# Patient Record
Sex: Female | Born: 1995 | Race: Black or African American | Hispanic: No | Marital: Single | Smoking: Never smoker
Health system: Southern US, Community
[De-identification: ages and names within clinical notes are randomized; demographics above are authoritative.]

## PROBLEM LIST (undated history)

## (undated) DIAGNOSIS — J302 Other seasonal allergic rhinitis: Secondary | ICD-10-CM

---
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
## 2013-09-21 ENCOUNTER — Encounter (HOSPITAL_COMMUNITY): Payer: Self-pay | Admitting: Emergency Medicine

## 2013-09-21 ENCOUNTER — Emergency Department (HOSPITAL_COMMUNITY)
Admission: EM | Admit: 2013-09-21 | Discharge: 2013-09-21 | Disposition: A | Discharge status: Home / Self Care | Payer: BC Managed Care – PPO | Source: Home / Self Care | Attending: Emergency Medicine | Admitting: Emergency Medicine

## 2013-09-21 DIAGNOSIS — L259 Unspecified contact dermatitis, unspecified cause: Secondary | ICD-10-CM

## 2013-09-21 MED ORDER — PREDNISONE 20 MG PO TABS: ORAL_TABLET | ORAL | Status: DC

## 2013-09-21 MED ORDER — TRIAMCINOLONE ACETONIDE 0.1 % EX CREA: TOPICAL_CREAM | Freq: Three times a day (TID) | CUTANEOUS | Status: DC

## 2013-09-21 NOTE — ED Notes (Signed)
See physicians assessment. 

## 2013-09-21 NOTE — ED Provider Notes (Signed)
Chief Complaint:   Chief Complaint  Patient presents with  . Allergic Reaction    History of Present Illness:   Shannon Cisneros is a 17 year old female who has a five-day history of a mildly pruritic rash on her neck, upper chest, and behind or ears. This came on after she used a new hair care product containing several oils including Ahaba oil. She tried Benadryl and hydrocortisone cream but has not gone away yet. She denies any rash elsewhere. She's had no difficulty breathing or swelling of her lips, tongue, or throat. There've been no other suspicious exposures including no new soaps, detergents, washing powders, dryer sheets, or fabric softeners. No exposure to plants or animals. No new foods or medications. No new clothing. She has not worn any new or different jewelry.  Review of Systems:  Other than noted above, the patient denies any of the following symptoms: Systemic:  No fever, chills, sweats, weight loss, or fatigue. ENT:  No nasal congestion, rhinorrhea, sore throat, swelling of lips, tongue or throat. Resp:  No cough, wheezing, or shortness of breath. Skin:  No rash, itching, nodules, or suspicious lesions.  PMFSH:  Past medical history, family history, social history, meds, and allergies were reviewed. She has seasonal allergies and takes Zyrtec as needed.  Physical Exam:   Vital signs:  BP 123/70  Pulse 70  Temp(Src) 99.1 F (37.3 C) (Oral)  Resp 18  SpO2 100%  LMP 08/16/2013 Gen:  Alert, oriented, in no distress. ENT:  Pharynx clear, no intraoral lesions, moist mucous membranes. Lungs:  Clear to auscultation. Skin:  There was a rash consisting of small, erythematous maculopapules on both sides neck and the posterior neck extending up behind the ears and temporal area. There was no rash in the scalp or the face. No rash on the chest or elsewhere in the body.  Assessment:  The encounter diagnosis was Contact dermatitis.  Probably due to the haircare oral that she  used.  Plan:   1.  Meds:  The following meds were prescribed:   Discharge Medication List as of 09/21/2013  3:29 PM    START taking these medications   Details  predniSONE (DELTASONE) 20 MG tablet 3 daily for 4 days, 2 daily for 4 days, 1 daily for 4 days., Normal    triamcinolone cream (KENALOG) 0.1 % Apply topically 3 (three) times daily., Starting 09/21/2013, Until Discontinued, Normal        2.  Patient Education/Counseling:  The patient was given appropriate handouts, self care instructions, and instructed in symptomatic relief.  The patient was advised not to use the haircare oil again.  3.  Follow up:  The patient was told to follow up if no better in 3 to 4 days, if becoming worse in any way, and given some red flag symptoms such as difficulty breathing, wheezing, or swelling of the lips, tongue, or throat which would prompt immediate return.  Follow up here as necessary.      Reuben Likes, MD 09/21/13 [phone]

## 2013-10-14 ENCOUNTER — Encounter (HOSPITAL_COMMUNITY): Payer: Self-pay | Admitting: Emergency Medicine

## 2013-10-14 ENCOUNTER — Other Ambulatory Visit (HOSPITAL_COMMUNITY)
Admission: RE | Admit: 2013-10-14 | Discharge: 2013-10-14 | Disposition: A | Discharge status: Home / Self Care | Payer: BC Managed Care – PPO | Source: Ambulatory Visit | Attending: Family Medicine | Admitting: Family Medicine

## 2013-10-14 ENCOUNTER — Emergency Department (HOSPITAL_COMMUNITY)
Admission: EM | Admit: 2013-10-14 | Discharge: 2013-10-14 | Disposition: A | Discharge status: Home / Self Care | Payer: BC Managed Care – PPO | Source: Home / Self Care

## 2013-10-14 DIAGNOSIS — N390 Urinary tract infection, site not specified: Secondary | ICD-10-CM

## 2013-10-14 DIAGNOSIS — Z113 Encounter for screening for infections with a predominantly sexual mode of transmission: Secondary | ICD-10-CM | POA: Insufficient documentation

## 2013-10-14 DIAGNOSIS — N760 Acute vaginitis: Secondary | ICD-10-CM | POA: Insufficient documentation

## 2013-10-14 LAB — POCT URINALYSIS DIP (DEVICE)
Glucose, UA: NEGATIVE mg/dL
Ketones, ur: 40 mg/dL — AB
Protein, ur: 100 mg/dL — AB
Urobilinogen, UA: 0.2 mg/dL (ref 0.0–1.0)

## 2013-10-14 LAB — POCT PREGNANCY, URINE: Preg Test, Ur: NEGATIVE

## 2013-10-14 MED ORDER — CEPHALEXIN 500 MG PO CAPS: 500.0000 mg | ORAL_CAPSULE | Freq: Four times a day (QID) | ORAL | Status: DC

## 2013-10-14 NOTE — ED Provider Notes (Addendum)
CSN: [id number]     Arrival date & time 10/14/13  1115 History   None    Chief Complaint  Patient presents with  . Vaginal Discharge   (Consider location/radiation/quality/duration/timing/severity/associated sxs/prior Treatment) Patient is a 17 y.o. female presenting with vaginal discharge. The history is provided by the patient.  Vaginal Discharge Quality:  Watery and malodorous Severity:  Mild Onset quality:  Gradual Duration:  3 days Progression:  Unchanged Chronicity:  New Context: after intercourse and during urination   Associated symptoms: dysuria   Associated symptoms: no abdominal pain, no fever, no vaginal itching and no vomiting     History reviewed. No pertinent past medical history. History reviewed. No pertinent past surgical history. History reviewed. No pertinent family history. History  Substance Use Topics  . Smoking status: Never Smoker   . Smokeless tobacco: Not on file  . Alcohol Use: No   OB History   Grav Para Term Preterm Abortions TAB SAB Ect Mult Living                 Review of Systems  Constitutional: Negative for fever.  Gastrointestinal: Negative.  Negative for vomiting and abdominal pain.  Genitourinary: Positive for dysuria and vaginal discharge. Negative for vaginal bleeding.    Allergies  Review of patient's allergies indicates no known allergies.  Home Medications   Current Outpatient Rx  Name  Route  Sig  Dispense  Refill  . cephALEXin (KEFLEX) 500 MG capsule   Oral   Take 1 capsule (500 mg total) by mouth 4 (four) times daily. Take all of medicine and drink lots of fluids   20 capsule   0   . cetirizine (ZYRTEC) 10 MG tablet   Oral   Take 10 mg by mouth daily.         . predniSONE (DELTASONE) 20 MG tablet      3 daily for 4 days, 2 daily for 4 days, 1 daily for 4 days.   24 tablet   0   . triamcinolone cream (KENALOG) 0.1 %   Topical   Apply topically 3 (three) times daily.   85.2 g   0    BP 112/70  Pulse  72  Temp(Src) 98.6 F (37 C) (Oral)  Resp 16  SpO2 100%  LMP 10/12/2013 Physical Exam  Nursing note and vitals reviewed. Constitutional: She is oriented to person, place, and time. She appears well-developed and well-nourished.  Abdominal: Soft. Bowel sounds are normal. There is no tenderness.  Genitourinary: Uterus normal. Vaginal discharge found.  Neurological: She is alert and oriented to person, place, and time.  Skin: Skin is warm and dry.    ED Course  Procedures (including critical care time) Labs Review Labs Reviewed  POCT URINALYSIS DIP (DEVICE) - Abnormal; Notable for the following:    Ketones, ur 40 (*)    Hgb urine dipstick LARGE (*)    Protein, ur 100 (*)    Leukocytes, UA LARGE (*)    All other components within normal limits  POCT PREGNANCY, URINE  CERVICOVAGINAL ANCILLARY ONLY   Imaging Review No results found.    MDM  U/a pos.    Linna Hoff, MD 10/14/13 1203  Linna Hoff, MD 10/14/13 [phone]

## 2013-10-14 NOTE — ED Notes (Signed)
Pt  Reports  Symptoms  Of  A  Slight  Vaginal  Discharge  As  Well  As  Some  Burning    In the  Vaginal area   For  sev  Days        She  Ambulated  To to room   with a  Steady  Fluid  Gait

## 2014-11-18 ENCOUNTER — Encounter (HOSPITAL_COMMUNITY): Payer: Self-pay | Admitting: *Deleted

## 2014-11-18 ENCOUNTER — Emergency Department (HOSPITAL_COMMUNITY)
Admission: EM | Admit: 2014-11-18 | Discharge: 2014-11-18 | Disposition: A | Discharge status: Home / Self Care | Payer: BC Managed Care – PPO | Attending: Emergency Medicine | Admitting: Emergency Medicine

## 2014-11-18 DIAGNOSIS — N3091 Cystitis, unspecified with hematuria: Secondary | ICD-10-CM | POA: Insufficient documentation

## 2014-11-18 DIAGNOSIS — Z79899 Other long term (current) drug therapy: Secondary | ICD-10-CM | POA: Insufficient documentation

## 2014-11-18 DIAGNOSIS — Z7952 Long term (current) use of systemic steroids: Secondary | ICD-10-CM | POA: Insufficient documentation

## 2014-11-18 DIAGNOSIS — Z3202 Encounter for pregnancy test, result negative: Secondary | ICD-10-CM | POA: Insufficient documentation

## 2014-11-18 LAB — URINE MICROSCOPIC-ADD ON

## 2014-11-18 LAB — URINALYSIS, ROUTINE W REFLEX MICROSCOPIC
Bilirubin Urine: NEGATIVE
GLUCOSE, UA: NEGATIVE mg/dL
Ketones, ur: NEGATIVE mg/dL
Nitrite: NEGATIVE
PH: 6 (ref 5.0–8.0)
PROTEIN: 30 mg/dL — AB
SPECIFIC GRAVITY, URINE: 1.019 (ref 1.005–1.030)
Urobilinogen, UA: 0.2 mg/dL (ref 0.0–1.0)

## 2014-11-18 LAB — POC URINE PREG, ED: PREG TEST UR: NEGATIVE

## 2014-11-18 MED ORDER — CEPHALEXIN 500 MG PO CAPS: 500.0000 mg | ORAL_CAPSULE | Freq: Four times a day (QID) | ORAL | Status: DC

## 2014-11-18 MED ORDER — PHENAZOPYRIDINE HCL 200 MG PO TABS: 200.0000 mg | ORAL_TABLET | Freq: Three times a day (TID) | ORAL | Status: DC

## 2014-11-18 NOTE — ED Notes (Signed)
Pt reports several days of urinary urgency, frequency, burning pain and blood in urine. Denies vaginal symptoms.

## 2014-11-18 NOTE — ED Notes (Signed)
Called mini-lab and requested to send urine sample to main lab for U/A.

## 2014-11-18 NOTE — ED Provider Notes (Signed)
CSN: [id number]     Arrival date & time 11/18/14  1025 History   First MD Initiated Contact with Patient 11/18/14 1123     Chief Complaint  Patient presents with  . Hematuria   (Consider location/radiation/quality/duration/timing/severity/associated sxs/prior Treatment) HPI  Shannon Cisneros is a [age] yo female presenting with report of noticing blood in her urine x 2 days.  She states starting Monday she noticed when she urinated the water in the toilet appeared pink.  She has irregular periods because of an contraceptive im[plant, but reports she has not started her period.  She does note some urinary pain and pressure just before and during urination. She has had sexual intercourse with one partner over the weekend, once unprotected and 2 subsequent times with protection.  She denies any vaginal discharge, fevers, chills, nausea, vomiting, abd or back pain     History reviewed. No pertinent past medical history. History reviewed. No pertinent past surgical history. History reviewed. No pertinent family history. History  Substance Use Topics  . Smoking status: Never Smoker   . Smokeless tobacco: Not on file  . Alcohol Use: No   OB History    No data available     Review of Systems  Constitutional: Negative for fever and chills.  HENT: Negative for sore throat.   Eyes: Negative for visual disturbance.  Respiratory: Negative for cough and shortness of breath.   Cardiovascular: Negative for chest pain and leg swelling.  Gastrointestinal: Negative for nausea, vomiting, abdominal pain and diarrhea.  Genitourinary: Positive for dysuria and hematuria. Negative for flank pain and vaginal discharge.  Musculoskeletal: Negative for myalgias.  Skin: Negative for rash.  Neurological: Negative for weakness, numbness and headaches.      Allergies  Review of patient's allergies indicates no known allergies.  Home Medications   Prior to Admission medications   Medication Sig Start Date End  Date Taking? Authorizing Provider  cetirizine (ZYRTEC) 10 MG tablet Take 10 mg by mouth daily.   Yes Historical Provider, MD  etonogestrel (NEXPLANON) 68 MG IMPL implant 1 each by Subdermal route once.   Yes Historical Provider, MD  ibuprofen (ADVIL,MOTRIN) 400 MG tablet Take 400 mg by mouth every 6 (six) hours as needed for fever, headache or mild pain.   Yes Historical Provider, MD  cephALEXin (KEFLEX) 500 MG capsule Take 1 capsule (500 mg total) by mouth 4 (four) times daily. Take all of medicine and drink lots of fluids Patient not taking: Reported on 11/18/2014 10/14/13   Linna HoffJames D Kindl, MD  predniSONE (DELTASONE) 20 MG tablet 3 daily for 4 days, 2 daily for 4 days, 1 daily for 4 days. Patient not taking: Reported on 11/18/2014 09/21/13   Reuben Likesavid C Keller, MD  triamcinolone cream (KENALOG) 0.1 % Apply topically 3 (three) times daily. Patient not taking: Reported on 11/18/2014 09/21/13   Reuben Likesavid C Keller, MD   BP 130/75 mmHg  Pulse 74  Temp(Src) 97.9 F (36.6 C) (Oral)  Resp 18  SpO2 100%  LMP 08/26/2014 Physical Exam  Constitutional: She appears well-developed and well-nourished. No distress.  HENT:  Head: Normocephalic and atraumatic.  Mouth/Throat: Oropharynx is clear and moist. No oropharyngeal exudate.  Eyes: Conjunctivae are normal.  Neck: Neck supple. No thyromegaly present.  Cardiovascular: Normal rate, regular rhythm and intact distal pulses.   Pulmonary/Chest: Effort normal and breath sounds normal. No respiratory distress.  Abdominal: Soft. There is no hepatosplenomegaly. There is tenderness in the suprapubic area. There is no rigidity, no rebound, no  guarding, no CVA tenderness, no tenderness at McBurney's point and negative Murphy's sign.  Musculoskeletal: She exhibits no tenderness.  Lymphadenopathy:    She has no cervical adenopathy.  Neurological: She is alert.  Skin: Skin is warm and dry. No rash noted. She is not diaphoretic.  Psychiatric: She has a normal mood and  affect.  Nursing note and vitals reviewed.   ED Course  Procedures (including critical care time) Labs Review Labs Reviewed  URINALYSIS, ROUTINE W REFLEX MICROSCOPIC - Abnormal; Notable for the following:    APPearance HAZY (*)    Hgb urine dipstick LARGE (*)    Protein, ur 30 (*)    Leukocytes, UA MODERATE (*)    All other components within normal limits  URINE MICROSCOPIC-ADD ON - Abnormal; Notable for the following:    Squamous Epithelial / LPF FEW (*)    Bacteria, UA FEW (*)    All other components within normal limits  URINE CULTURE  POC URINE PREG, ED    Imaging Review No results found.   EKG Interpretation None      MDM   Final diagnoses:  Hemorrhagic cystitis   18 yo with hematuria and UA consistent with UTI. She is afebrile, with no CVA tenderness, normotensive, and denies N/V or vaginal symptoms.  Pt is well-appearing, in no acute distress and vital signs are stable.  She appears safe to be discharged.  Discharge include antibiotics and follow-up with their PCP.  Return precautions provided. Pt aware of plan and in agreement.    Filed Vitals:   11/18/14 1143 11/18/14 1145 11/18/14 1200 11/18/14 1215  BP: 118/75 130/75 101/75 111/72  Pulse: 73 74 70 96  Temp:      TempSrc:      Resp: 18     SpO2: 98% 100% 100% 100%   Meds given in ED:  Medications - No data to display  Discharge Medication List as of 11/18/2014 12:35 PM    START taking these medications   Details  phenazopyridine (PYRIDIUM) 200 MG tablet Take 1 tablet (200 mg total) by mouth 3 (three) times daily., Starting 11/18/2014, Until Discontinued, Print       11/18/14 0000  phenazopyridine (PYRIDIUM) 200 MG tablet 3 times daily Discontinue Reprint 11/18/14 1233   11/18/14 0000  cephALEXin (KEFLEX) 500 MG capsule 4 times daily Discontinue Reprint 11/18/14 1233          Harle BattiestElizabeth Hammad Finkler, NP 11/18/14 2244  Flint MelterElliott L Wentz, MD 11/22/14 [phone]

## 2014-11-18 NOTE — Discharge Instructions (Signed)
Please follow the directions provided.  Drink plenty of fluids, be sure to always wipe front to back and urinate before and after intercourse.  Take your medications as directed.  Be sure to follow-up with your primary care provider to ensure you are getting better.      SEEK IMMEDIATE MEDICAL CARE IF:  You have severe back pain or lower abdominal pain.  You develop chills.  You have nausea or vomiting.  You have continued burning or discomfort with urination.

## 2014-11-21 LAB — URINE CULTURE

## 2014-11-22 ENCOUNTER — Telehealth: Payer: Self-pay | Admitting: Emergency Medicine

## 2014-11-22 NOTE — Telephone Encounter (Signed)
Post ED Visit - Positive Culture Follow-up  Culture report reviewed by antimicrobial stewardship pharmacist: [] Wes Dulaney, Pharm.D., BCPS [] Jeremy Frens, Pharm.D., BCPS [] Elizabeth Martin, Pharm.D., BCPS [] Minh Pham, Pharm.D., BCPS, AAHIVP [x] Michelle Turner, Pharm.D., BCPS, AAHIVP [] Lorie Poole, Pharm.D., BCPS  Positive urine culture Treated with Keflex, organism sensitive to the same and no further patient follow-up is required at this time.  Shannon Cisneros 11/22/2014, 12:26 PM   

## 2015-02-21 ENCOUNTER — Encounter (HOSPITAL_COMMUNITY): Payer: Self-pay | Admitting: *Deleted

## 2015-02-21 ENCOUNTER — Emergency Department (INDEPENDENT_AMBULATORY_CARE_PROVIDER_SITE_OTHER)
Admission: EM | Admit: 2015-02-21 | Discharge: 2015-02-21 | Disposition: A | Discharge status: Home / Self Care | Payer: BLUE CROSS/BLUE SHIELD | Source: Home / Self Care | Attending: Family Medicine | Admitting: Family Medicine

## 2015-02-21 DIAGNOSIS — K5900 Constipation, unspecified: Secondary | ICD-10-CM

## 2015-02-21 DIAGNOSIS — A084 Viral intestinal infection, unspecified: Secondary | ICD-10-CM

## 2015-02-21 HISTORY — DX: Other seasonal allergic rhinitis: J30.2

## 2015-02-21 LAB — POCT PREGNANCY, URINE: Preg Test, Ur: NEGATIVE

## 2015-02-21 MED ORDER — ONDANSETRON HCL 4 MG PO TABS: 4.0000 mg | ORAL_TABLET | Freq: Three times a day (TID) | ORAL | Status: DC | PRN

## 2015-02-21 NOTE — ED Notes (Signed)
Reports HA, nausea, temps up to 101 intermittently x 8 days.  Started after going to restaurant in evening after not eating all day & consuming alcohol; thought she was hungover, but states sxs will not resolve.  Has been taking IBU and Zyrtec; IBU helps HA, but then HA returns.  Just arrived back from trip to [place] via car.

## 2015-02-21 NOTE — ED Provider Notes (Signed)
CSN: [id number]     Arrival date & time 02/21/15  1517 History   First MD Initiated Contact with Patient 02/21/15 1546     Chief Complaint  Patient presents with  . Nausea  . Headache   (Consider location/radiation/quality/duration/timing/severity/associated sxs/prior Treatment) HPI   7-10 days ago developed nausea and HA. Ibuprofen 800mg  w/ some improvement. Associated w/ decreased appetite. Started with fevers 7 days ago to a max of 101 at night on some nights. Denies any abd pain, vomiting, diarrhea, rash, CP, SOB. Started w/ cough 1 day ago. Denies runny nose, congestion. Overall seems to be improving.  Nexplanon due to come out in a few months.     Past Medical History  Diagnosis Date  . Seasonal allergies    History reviewed. No pertinent past surgical history. Family History  Problem Relation Age of Onset  . Diabetes Mother   . Hypertension Mother    History  Substance Use Topics  . Smoking status: Never Smoker   . Smokeless tobacco: Not on file  . Alcohol Use: No   OB History    No data available     Review of Systems Per HPI with all other pertinent systems negative.   Allergies  Review of patient's allergies indicates no known allergies.  Home Medications   Prior to Admission medications   Medication Sig Start Date End Date Taking? Authorizing Provider  cetirizine (ZYRTEC) 10 MG tablet Take 10 mg by mouth daily.   Yes Historical Provider, MD  etonogestrel (NEXPLANON) 68 MG IMPL implant 1 each by Subdermal route once.   Yes Historical Provider, MD  ibuprofen (ADVIL,MOTRIN) 400 MG tablet Take 400 mg by mouth every 6 (six) hours as needed for fever, headache or mild pain.   Yes Historical Provider, MD  ondansetron (ZOFRAN) 4 MG tablet Take 1 tablet (4 mg total) by mouth every 8 (eight) hours as needed for nausea or vomiting. 02/21/15   Ozella Rocksavid J Ihsan Nomura, MD   BP 114/68 mmHg  Pulse 84  Temp(Src) 98.3 F (36.8 C) (Oral)  Resp 16  SpO2 98% Physical Exam   Constitutional: She is oriented to person, place, and time. She appears well-developed and well-nourished.  HENT:  Head: Normocephalic and atraumatic.  Eyes: EOM are normal. Pupils are equal, round, and reactive to light.  Neck: Normal range of motion.  Cardiovascular: Normal rate, normal heart sounds and intact distal pulses.   Pulmonary/Chest: Effort normal and breath sounds normal.  Abdominal: Bowel sounds are normal.  Musculoskeletal: Normal range of motion. She exhibits no tenderness.  Neurological: She is alert and oriented to person, place, and time. No cranial nerve deficit. She exhibits normal muscle tone. Coordination normal.  Skin: Skin is warm and dry.  Psychiatric: She has a normal mood and affect. Her behavior is normal. Judgment and thought content normal.    ED Course  Procedures (including critical care time) Labs Review Labs Reviewed  POCT PREGNANCY, URINE    Imaging Review No results found.   MDM   1. Viral gastroenteritis   2. Constipation, unspecified constipation type    Start probiotic, Zofran, continue ibuprofen, start increasing titration dose of MiraLAX for constipation. No acute abdomen. Headache related to viral infection and dehydration.  Precautions given and all questions answered.  Shelly Flattenavid Kylon Philbrook, MD Family Medicine 02/21/2015, 4:26 PM      Ozella Rocksavid J Rual Vermeer, MD 02/21/15 [phone]

## 2015-02-21 NOTE — Discharge Instructions (Signed)
Your likely suffering from a got infection called viral gastroenteritis. Please start using the Zofran for her nausea. Please continue using the ibuprofen for your fever and headache. Please start taking a probiotic. Please start taking twice daily doses of MiraLAX and increasing the dose until you have daily soft bowel movements. This should resolve itself and another one to 5 days.

## 2015-03-04 ENCOUNTER — Emergency Department (HOSPITAL_COMMUNITY): Payer: BLUE CROSS/BLUE SHIELD

## 2015-03-04 ENCOUNTER — Emergency Department (HOSPITAL_COMMUNITY)
Admission: EM | Admit: 2015-03-04 | Discharge: 2015-03-04 | Disposition: A | Discharge status: Home / Self Care | Payer: BLUE CROSS/BLUE SHIELD | Attending: Emergency Medicine | Admitting: Emergency Medicine

## 2015-03-04 ENCOUNTER — Encounter (HOSPITAL_COMMUNITY): Payer: Self-pay | Admitting: *Deleted

## 2015-03-04 DIAGNOSIS — R112 Nausea with vomiting, unspecified: Secondary | ICD-10-CM | POA: Diagnosis not present

## 2015-03-04 DIAGNOSIS — Z79899 Other long term (current) drug therapy: Secondary | ICD-10-CM | POA: Insufficient documentation

## 2015-03-04 DIAGNOSIS — R05 Cough: Secondary | ICD-10-CM | POA: Diagnosis not present

## 2015-03-04 DIAGNOSIS — R5383 Other fatigue: Secondary | ICD-10-CM | POA: Insufficient documentation

## 2015-03-04 DIAGNOSIS — R509 Fever, unspecified: Secondary | ICD-10-CM | POA: Insufficient documentation

## 2015-03-04 DIAGNOSIS — Z3202 Encounter for pregnancy test, result negative: Secondary | ICD-10-CM | POA: Diagnosis not present

## 2015-03-04 DIAGNOSIS — Z792 Long term (current) use of antibiotics: Secondary | ICD-10-CM | POA: Insufficient documentation

## 2015-03-04 DIAGNOSIS — Z8709 Personal history of other diseases of the respiratory system: Secondary | ICD-10-CM | POA: Insufficient documentation

## 2015-03-04 LAB — COMPREHENSIVE METABOLIC PANEL
ALT: 261 U/L — ABNORMAL HIGH (ref 0–35)
AST: 126 U/L — AB (ref 0–37)
Albumin: 3.8 g/dL (ref 3.5–5.2)
Alkaline Phosphatase: 108 U/L (ref 39–117)
Anion gap: 9 (ref 5–15)
BILIRUBIN TOTAL: 1.5 mg/dL — AB (ref 0.3–1.2)
BUN: <5 mg/dL — ABNORMAL LOW (ref 6–23)
CHLORIDE: 107 mmol/L (ref 96–112)
CO2: 24 mmol/L (ref 19–32)
Calcium: 9.5 mg/dL (ref 8.4–10.5)
Creatinine, Ser: 0.73 mg/dL (ref 0.50–1.10)
GFR calc Af Amer: >90 mL/min (ref >90)
GFR calc non Af Amer: >90 mL/min (ref >90)
Glucose, Bld: 102 mg/dL — ABNORMAL HIGH (ref 70–99)
Potassium: 3.7 mmol/L (ref 3.5–5.1)
SODIUM: 140 mmol/L (ref 135–145)
Total Protein: 8.9 g/dL — ABNORMAL HIGH (ref 6.0–8.3)

## 2015-03-04 LAB — CBC WITH DIFFERENTIAL/PLATELET
BASOS ABS: 0.5 10*3/uL — AB (ref 0.0–0.1)
BASOS PCT: 4 % — AB (ref 0–1)
EOS PCT: 0 % (ref 0–5)
Eosinophils Absolute: 0 10*3/uL (ref 0.0–0.7)
HEMATOCRIT: 43.4 % (ref 36.0–46.0)
HEMOGLOBIN: 13.9 g/dL (ref 12.0–15.0)
Lymphocytes Relative: 71 % — ABNORMAL HIGH (ref 12–46)
Lymphs Abs: 9.3 10*3/uL — ABNORMAL HIGH (ref 0.7–4.0)
MCH: 27.7 pg (ref 26.0–34.0)
MCHC: 32 g/dL (ref 30.0–36.0)
MCV: 86.6 fL (ref 78.0–100.0)
Monocytes Absolute: 1.4 10*3/uL — ABNORMAL HIGH (ref 0.1–1.0)
Monocytes Relative: 11 % (ref 3–12)
NEUTROS ABS: 1.8 10*3/uL (ref 1.7–7.7)
Neutrophils Relative %: 14 % — ABNORMAL LOW (ref 43–77)
Platelets: 222 10*3/uL (ref 150–400)
RBC: 5.01 MIL/uL (ref 3.87–5.11)
RDW: 14 % (ref 11.5–15.5)
WBC: 13 10*3/uL — ABNORMAL HIGH (ref 4.0–10.5)

## 2015-03-04 LAB — URINE MICROSCOPIC-ADD ON

## 2015-03-04 LAB — PREGNANCY, URINE: Preg Test, Ur: NEGATIVE

## 2015-03-04 LAB — URINALYSIS, ROUTINE W REFLEX MICROSCOPIC
Bilirubin Urine: NEGATIVE
Glucose, UA: NEGATIVE mg/dL
HGB URINE DIPSTICK: NEGATIVE
Ketones, ur: NEGATIVE mg/dL
NITRITE: NEGATIVE
Protein, ur: NEGATIVE mg/dL
SPECIFIC GRAVITY, URINE: 1.007 (ref 1.005–1.030)
UROBILINOGEN UA: 1 mg/dL (ref 0.0–1.0)
pH: 7 (ref 5.0–8.0)

## 2015-03-04 LAB — MONONUCLEOSIS SCREEN: MONO SCREEN: NEGATIVE

## 2015-03-04 LAB — LIPASE, BLOOD: Lipase: 62 U/L — ABNORMAL HIGH (ref 11–59)

## 2015-03-04 MED ORDER — ONDANSETRON 4 MG PO TBDP: 4.0000 mg | ORAL_TABLET | Freq: Three times a day (TID) | ORAL | Status: DC | PRN

## 2015-03-04 MED ORDER — SODIUM CHLORIDE 0.9 % IV BOLUS (SEPSIS)
1000.0000 mL | Freq: Once | INTRAVENOUS | Status: AC
  Administered 2015-03-04: 1000 mL via INTRAVENOUS

## 2015-03-04 MED ORDER — ONDANSETRON HCL 4 MG/2ML IJ SOLN
4.0000 mg | Freq: Once | INTRAMUSCULAR | Status: AC
  Administered 2015-03-04: 4 mg via INTRAVENOUS
  Filled 2015-03-04: qty 2

## 2015-03-04 MED ORDER — GUAIFENESIN-CODEINE 100-10 MG/5ML PO SOLN: 5.0000 mL | Freq: Three times a day (TID) | ORAL | Status: AC | PRN

## 2015-03-04 NOTE — ED Notes (Signed)
Pt off unit with xray 

## 2015-03-04 NOTE — Discharge Instructions (Signed)
Fatigue °Fatigue is a feeling of tiredness, lack of energy, lack of motivation, or feeling tired all the time. Having enough rest, good nutrition, and reducing stress will normally reduce fatigue. Consult your caregiver if it persists. The nature of your fatigue will help your caregiver to find out its cause. The treatment is based on the cause.  °CAUSES  °There are many causes for fatigue. Most of the time, fatigue can be traced to one or more of your habits or routines. Most causes fit into one or more of three general areas. They are: °Lifestyle problems °· Sleep disturbances. °· Overwork. °· Physical exertion. °· Unhealthy habits. °· Poor eating habits or eating disorders. °· Alcohol and/or drug use . °· Lack of proper nutrition (malnutrition). °Psychological problems °· Stress and/or anxiety problems. °· Depression. °· Grief. °· Boredom. °Medical Problems or Conditions °· Anemia. °· Pregnancy. °· Thyroid gland problems. °· Recovery from major surgery. °· Continuous pain. °· Emphysema or asthma that is not well controlled °· Allergic conditions. °· Diabetes. °· Infections (such as mononucleosis). °· Obesity. °· Sleep disorders, such as sleep apnea. °· Heart failure or other heart-related problems. °· Cancer. °· Kidney disease. °· Liver disease. °· Effects of certain medicines such as antihistamines, cough and cold remedies, prescription pain medicines, heart and blood pressure medicines, drugs used for treatment of cancer, and some antidepressants. °SYMPTOMS  °The symptoms of fatigue include:  °· Lack of energy. °· Lack of drive (motivation). °· Drowsiness. °· Feeling of indifference to the surroundings. °DIAGNOSIS  °The details of how you feel help guide your caregiver in finding out what is causing the fatigue. You will be asked about your present and past health condition. It is important to review all medicines that you take, including prescription and non-prescription items. A thorough exam will be done.  You will be questioned about your feelings, habits, and normal lifestyle. Your caregiver may suggest blood tests, urine tests, or other tests to look for common medical causes of fatigue.  °TREATMENT  °Fatigue is treated by correcting the underlying cause. For example, if you have continuous pain or depression, treating these causes will improve how you feel. Similarly, adjusting the dose of certain medicines will help in reducing fatigue.  °HOME CARE INSTRUCTIONS  °· Try to get the required amount of good sleep every night. °· Eat a healthy and nutritious diet, and drink enough water throughout the day. °· Practice ways of relaxing (including yoga or meditation). °· Exercise regularly. °· Make plans to change situations that cause stress. Act on those plans so that stresses decrease over time. Keep your work and personal routine reasonable. °· Avoid street drugs and minimize use of alcohol. °· Start taking a daily multivitamin after consulting your caregiver. °SEEK MEDICAL CARE IF:  °· You have persistent tiredness, which cannot be accounted for. °· You have fever. °· You have unintentional weight loss. °· You have headaches. °· You have disturbed sleep throughout the night. °· You are feeling sad. °· You have constipation. °· You have dry skin. °· You have gained weight. °· You are taking any new or different medicines that you suspect are causing fatigue. °· You are unable to sleep at night. °· You develop any unusual swelling of your legs or other parts of your body. °SEEK IMMEDIATE MEDICAL CARE IF:  °· You are feeling confused. °· Your vision is blurred. °· You feel faint or pass out. °· You develop severe headache. °· You develop severe abdominal, pelvic, or   back pain.  You develop chest pain, shortness of breath, or an irregular or fast heartbeat.  You are unable to pass a normal amount of urine.  You develop abnormal bleeding such as bleeding from the rectum or you vomit blood.  You have thoughts  about harming yourself or committing suicide.  You are worried that you might harm someone else. MAKE SURE YOU:   Understand these instructions.  Will watch your condition.  Will get help right away if you are not doing well or get worse. Document Released: 09/24/2007 Document Revised: 02/19/2012 Document Reviewed: 03/31/2014 [facility] Patient Information 2015 [place], [place]. This information is not intended to replace advice given to you by your health care provider. Make sure you discuss any questions you have with your health care provider.  Fever, Adult A fever is a higher than normal body temperature. In an adult, an oral temperature around 98.6 F (37 C) is considered normal. A temperature of 100.4 F (38 C) or higher is generally considered a fever. Mild or moderate fevers generally have no long-term effects and often do not require treatment. Extreme fever (greater than or equal to 106 F or 41.1 C) can cause seizures. The sweating that may occur with repeated or prolonged fever may cause dehydration. Elderly people can develop confusion during a fever. A measured temperature can vary with:  Age.  Time of day.  Method of measurement (mouth, underarm, rectal, or ear). The fever is confirmed by taking a temperature with a thermometer. Temperatures can be taken different ways. Some methods are accurate and some are not.  An oral temperature is used most commonly. Electronic thermometers are fast and accurate.  An ear temperature will only be accurate if the thermometer is positioned as recommended by the manufacturer.  A rectal temperature is accurate and done for those adults who have a condition where an oral temperature cannot be taken.  An underarm (axillary) temperature is not accurate and not recommended. Fever is a symptom, not a disease.  CAUSES   Infections commonly cause fever.  Some noninfectious causes for fever include:  Some arthritis conditions.  Some  thyroid or adrenal gland conditions.  Some immune system conditions.  Some types of cancer.  A medicine reaction.  High doses of certain street drugs such as methamphetamine.  Dehydration.  Exposure to high outside or room temperatures.  Occasionally, the source of a fever cannot be determined. This is sometimes called a "fever of unknown origin" (FUO).  Some situations may lead to a temporary rise in body temperature that may go away on its own. Examples are:  Childbirth.  Surgery.  Intense exercise. HOME CARE INSTRUCTIONS   Take appropriate medicines for fever. Follow dosing instructions carefully. If you use acetaminophen to reduce the fever, be careful to avoid taking other medicines that also contain acetaminophen. Do not take aspirin for a fever if you are younger than age 67. There is an association with Reye's syndrome. Reye's syndrome is a rare but potentially deadly disease.  If an infection is present and antibiotics have been prescribed, take them as directed. Finish them even if you start to feel better.  Rest as needed.  Maintain an adequate fluid intake. To prevent dehydration during an illness with prolonged or recurrent fever, you may need to drink extra fluid.Drink enough fluids to keep your urine clear or pale yellow.  Sponging or bathing with room temperature water may help reduce body temperature. Do not use ice water or alcohol sponge baths.  Dress comfortably, but do not over-bundle. SEEK MEDICAL CARE IF:   You are unable to keep fluids down.  You develop vomiting or diarrhea.  You are not feeling at least partly better after 3 days.  You develop new symptoms or problems. SEEK IMMEDIATE MEDICAL CARE IF:   You have shortness of breath or trouble breathing.  You develop excessive weakness.  You are dizzy or you faint.  You are extremely thirsty or you are making little or no urine.  You develop new pain that was not there before (such as in  the head, neck, chest, back, or abdomen).  You have persistent vomiting and diarrhea for more than 1 to 2 days.  You develop a stiff neck or your eyes become sensitive to light.  You develop a skin rash.  You have a fever or persistent symptoms for more than 2 to 3 days.  You have a fever and your symptoms suddenly get worse. MAKE SURE YOU:   Understand these instructions.  Will watch your condition.  Will get help right away if you are not doing well or get worse. Document Released: 05/23/2001 Document Revised: 04/13/2014 Document Reviewed: 09/28/2011 [facility] Patient Information 2015 [place]. This information is not intended to replace advice given to you by your health care provider. Make sure you discuss any questions you have with your health care provider.

## 2015-03-04 NOTE — ED Notes (Signed)
Offered pt gingerale.

## 2015-03-04 NOTE — ED Provider Notes (Signed)
CSN: [id number]     Arrival date & time 03/04/15  1055 History   First MD Initiated Contact with Patient 03/04/15 1100     Chief Complaint  Patient presents with  . Bronchitis  . abnormal labs      (Consider location/radiation/quality/duration/timing/severity/associated sxs/prior Treatment) HPI    PCP: No PCP Per Patient Blood pressure 115/56, pulse 99, temperature 98.3 F (36.8 C), temperature source Oral, resp. rate 16, height  (1.575 m), weight 126 lb (57.153 kg), SpO2 98 %.  Lucile Hillmann is a 19 y.o.female with a significant PMH of diabetes and hypertension presents to the ER with complaints of low grade fever, fatigue, coughing, nausea and vomiting for 3 weeks. This is her third visit regarding these symptoms. She has been seen twice in the Urgent Care. The UC called her today and told her she had an abnormality on her chest xray and that she had a low WBC and that she needed to go to the ER. She has not felt worse or better in the past three weeks. She denies having night sweats, weight loss, abdominal pains, vaginal bleeding, vaginal pain, back pain, neck pain, headaches, confusion or focal weakness. No loss of bowel or urine control, no increased anxiety, arthralgias or myalgias.  Past Medical History  Diagnosis Date  . Seasonal allergies    History reviewed. No pertinent past surgical history. Family History  Problem Relation Age of Onset  . Diabetes Mother   . Hypertension Mother    History  Substance Use Topics  . Smoking status: Never Smoker   . Smokeless tobacco: Not on file  . Alcohol Use: No   OB History    No data available     Review of Systems  10 Systems reviewed and are negative for acute change except as noted in the HPI.    Allergies  Review of patient's allergies indicates no known allergies.  Home Medications   Prior to Admission medications   Medication Sig Start Date End Date Taking? Authorizing Provider  etonogestrel (NEXPLANON) 68  MG IMPL implant 1 each by Subdermal route once.   Yes Historical Provider, MD  ibuprofen (ADVIL,MOTRIN) 200 MG tablet Take 800 mg by mouth every 6 (six) hours as needed for mild pain or moderate pain.   Yes Historical Provider, MD  levofloxacin (LEVAQUIN) 500 MG tablet Take 500 mg by mouth daily. 03/01/15  Yes Historical Provider, MD  ondansetron (ZOFRAN) 4 MG tablet Take 1 tablet (4 mg total) by mouth every 8 (eight) hours as needed for nausea or vomiting. 02/21/15  Yes Ozella Rocks, MD  promethazine-dextromethorphan (PROMETHAZINE-DM) 6.25-15 MG/5ML syrup Take 7.5 mLs by mouth every 6 (six) hours as needed. 03/01/15  Yes Historical Provider, MD  guaiFENesin-codeine 100-10 MG/5ML syrup Take 5 mLs by mouth 3 (three) times daily as needed for cough. 03/04/15   Irene Mitcham Neva Seat, PA-C  ondansetron (ZOFRAN ODT) 4 MG disintegrating tablet Take 1 tablet (4 mg total) by mouth every 8 (eight) hours as needed for nausea or vomiting. 03/04/15   Shabazz Mckey Neva Seat, PA-C   BP 100/66 mmHg  Pulse 92  Temp(Src) 98.6 F (37 C) (Oral)  Resp 16  Ht  (1.575 m)  Wt 126 lb (57.153 kg)  BMI 23.04 kg/m2  SpO2 97% Physical Exam  Constitutional: She appears well-developed and well-nourished. No distress.  HENT:  Head: Normocephalic and atraumatic.  Right Ear: Tympanic membrane and ear canal normal.  Left Ear: Tympanic membrane and ear canal normal.  Nose:  Nose normal. Right sinus exhibits no maxillary sinus tenderness and no frontal sinus tenderness. Left sinus exhibits no maxillary sinus tenderness and no frontal sinus tenderness.  Mouth/Throat: Uvula is midline, oropharynx is clear and moist and mucous membranes are normal.  Eyes: Pupils are equal, round, and reactive to light.  Neck: Normal range of motion. Neck supple.  Cardiovascular: Normal rate and regular rhythm.   Pulmonary/Chest: Effort normal. No respiratory distress. She has no wheezes. She exhibits tenderness. She exhibits no crepitus and no retraction.      Pain worsens with pressure and ROM  Abdominal: Soft. She exhibits no distension. There is no tenderness. There is no rebound.  Musculoskeletal:  No lower extremity swelling   Neurological: She is alert.  Skin: Skin is warm and dry.  Nursing note and vitals reviewed.   ED Course  Procedures (including critical care time) Labs Review Labs Reviewed  CBC WITH DIFFERENTIAL/PLATELET - Abnormal; Notable for the following:    WBC 13.0 (*)    Neutrophils Relative % 14 (*)    Lymphocytes Relative 71 (*)    Basophils Relative 4 (*)    Lymphs Abs 9.3 (*)    Monocytes Absolute 1.4 (*)    Basophils Absolute 0.5 (*)    All other components within normal limits  COMPREHENSIVE METABOLIC PANEL - Abnormal; Notable for the following:    Glucose, Bld 102 (*)    BUN <5 (*)    Total Protein 8.9 (*)    AST 126 (*)    ALT 261 (*)    Total Bilirubin 1.5 (*)    All other components within normal limits  LIPASE, BLOOD - Abnormal; Notable for the following:    Lipase 62 (*)    All other components within normal limits  URINALYSIS, ROUTINE W REFLEX MICROSCOPIC - Abnormal; Notable for the following:    Leukocytes, UA MODERATE (*)    All other components within normal limits  URINE MICROSCOPIC-ADD ON - Abnormal; Notable for the following:    Squamous Epithelial / LPF MANY (*)    Bacteria, UA FEW (*)    All other components within normal limits  PREGNANCY, URINE  MONONUCLEOSIS SCREEN    Imaging Review Dg Abd Acute W/chest  03/04/2015   CLINICAL DATA:  19 year old female with cough for 2 weeks. Mid abdominal pain with nausea vomiting. Initial encounter.  EXAM: ACUTE ABDOMEN SERIES (ABDOMEN 2 VIEW & CHEST 1 VIEW)  COMPARISON:  None.  FINDINGS: Normal cardiac size and mediastinal contours. Normal lung volumes. No pneumothorax or pneumoperitoneum. No pleural effusion or abnormal pulmonary opacity. Visualized tracheal air column is within normal limits.  Non obstructed bowel gas pattern. Abdominal and  pelvic visceral contours are normal. Mild to moderate volume of retained stool in the distal colon. No osseous abnormality identified.  IMPRESSION: 1.  Normal bowel gas pattern, no free air. 2. Negative chest.   Electronically Signed   By: Odessa Fleming M.D.   On: 03/04/2015 12:36     EKG Interpretation None      MDM   Final diagnoses:  Fever, unspecified fever cause  Other fatigue   Patient is well-appearing, nontoxic. She has had a thorough panel of blood work done and the results are as follows:  Negative mono screen and negative urine pregnancy test. Her CBC shows a mildly elevated white blood cell count and her CMP shows elevated AST at 126 and ALT at 261 for mildly elevated bilirubin at 1.5. Her lipase is 62. The patient has  had some intermittent vomiting without any abdominal pain. Patient still nontender and nondistended to the abdomen. Her urinalysis shows moderate leukocytes with many epithelial cells, negative nitrites and only few bacteria believe that the urine is contaminated and will send out for culture.  A chest x-ray and abdominal x-ray were done and they showed no abnormal findings. The etiology of the patient's symptoms not been determined. Consider possible viral illness due to elevated LFTs first hepatitis on immunodeficiency disease. He denies taking Tylenol or any new medications.   The shunt does not have a primary care doctor therefore Durward MallardCamille with case management has arranged the patient an [facility]. Patient will be further worked up and reevaluated at this time.  19 y.o.Karissa Springston's evaluation in the Emergency Department is complete. It has been determined that no acute conditions requiring further emergency intervention are present at this time. The patient/guardian have been advised of the diagnosis and plan. We have discussed signs and symptoms that warrant return to the ED, such as changes or  worsening in symptoms.  Vital signs are stable at discharge. Filed Vitals:   03/04/15 1657  BP:   Pulse:   Temp: 98.6 F (37 C)  Resp:     Patient/guardian has voiced understanding and agreed to follow-up with the PCP or specialist.   Marlon Peliffany Kaysie Michelini, PA-C 03/05/15 0751  Tilden FossaElizabeth Rees, MD 03/05/15 [phone]

## 2015-03-04 NOTE — ED Notes (Signed)
Pt presents with N/V, coughing, fever x 3weeks.

## 2015-03-05 LAB — PATHOLOGIST SMEAR REVIEW

## 2015-03-05 NOTE — Care Management ED Note (Signed)
      CARE MANAGEMENT ED NOTE 03/04/2015  Patient:  Shannon Cisneros,Shannon Cisneros   Account Number:  [id number]  Date Initiated:  03/04/2015  Documentation initiated by:  [facility],Atlas Kuc  Subjective/Objective Assessment:   19 yo female in with N, V, fever,  bronchitis and abnormal labs.     Subjective/Objective Assessment Detail:   This is third visit to ED in 6 months and pt has been to Urgent Care Facilities in between.  Has been treated for viral     Action/Plan:   Labs, set-up PCP   Action/Plan Detail:   Anticipated DC Date:  03/04/2015     Status Recommendation to Physician:   Result of Recommendation:     In-house referral  PCP / [facility]   DC Planning Services  CM consult    Choice offered to / List presented to:            Status of service:    ED Comments:  NCM arranged appointment with Dr Amedeo PlentyPolite [facility] for 03/09/15 @ 1145  ED Comments Detail:  [facility].  Pt knows to complete paper work and take with her to appointment on Tuesday.

## 2015-03-07 LAB — URINE CULTURE
CULTURE: NO GROWTH
Colony Count: NO GROWTH

## 2015-03-18 ENCOUNTER — Emergency Department (HOSPITAL_COMMUNITY): Payer: BLUE CROSS/BLUE SHIELD

## 2015-03-18 ENCOUNTER — Encounter (HOSPITAL_COMMUNITY): Payer: Self-pay | Admitting: Physical Medicine and Rehabilitation

## 2015-03-18 ENCOUNTER — Emergency Department (HOSPITAL_COMMUNITY)
Admission: EM | Admit: 2015-03-18 | Discharge: 2015-03-18 | Disposition: A | Discharge status: Home / Self Care | Payer: BLUE CROSS/BLUE SHIELD | Attending: Emergency Medicine | Admitting: Emergency Medicine

## 2015-03-18 DIAGNOSIS — F419 Anxiety disorder, unspecified: Secondary | ICD-10-CM | POA: Diagnosis not present

## 2015-03-18 DIAGNOSIS — Z3202 Encounter for pregnancy test, result negative: Secondary | ICD-10-CM | POA: Diagnosis not present

## 2015-03-18 DIAGNOSIS — J219 Acute bronchiolitis, unspecified: Secondary | ICD-10-CM | POA: Diagnosis not present

## 2015-03-18 DIAGNOSIS — R200 Anesthesia of skin: Secondary | ICD-10-CM | POA: Insufficient documentation

## 2015-03-18 DIAGNOSIS — Z79899 Other long term (current) drug therapy: Secondary | ICD-10-CM | POA: Insufficient documentation

## 2015-03-18 DIAGNOSIS — J40 Bronchitis, not specified as acute or chronic: Secondary | ICD-10-CM | POA: Insufficient documentation

## 2015-03-18 DIAGNOSIS — J9801 Acute bronchospasm: Secondary | ICD-10-CM

## 2015-03-18 DIAGNOSIS — R0602 Shortness of breath: Secondary | ICD-10-CM | POA: Diagnosis present

## 2015-03-18 LAB — CBC WITH DIFFERENTIAL/PLATELET
Basophils Absolute: 0 10*3/uL (ref 0.0–0.1)
Basophils Relative: 1 % (ref 0–1)
EOS ABS: 0.1 10*3/uL (ref 0.0–0.7)
EOS PCT: 1 % (ref 0–5)
HCT: 42.3 % (ref 36.0–46.0)
HEMOGLOBIN: 13.6 g/dL (ref 12.0–15.0)
LYMPHS ABS: 2.1 10*3/uL (ref 0.7–4.0)
LYMPHS PCT: 24 % (ref 12–46)
MCH: 27.6 pg (ref 26.0–34.0)
MCHC: 32.2 g/dL (ref 30.0–36.0)
MCV: 86 fL (ref 78.0–100.0)
MONOS PCT: 10 % (ref 3–12)
Monocytes Absolute: 0.8 10*3/uL (ref 0.1–1.0)
Neutro Abs: 5.5 10*3/uL (ref 1.7–7.7)
Neutrophils Relative %: 64 % (ref 43–77)
PLATELETS: 226 10*3/uL (ref 150–400)
RBC: 4.92 MIL/uL (ref 3.87–5.11)
RDW: 13.9 % (ref 11.5–15.5)
WBC: 8.6 10*3/uL (ref 4.0–10.5)

## 2015-03-18 LAB — COMPREHENSIVE METABOLIC PANEL
ALT: 38 U/L — ABNORMAL HIGH (ref 0–35)
ANION GAP: 10 (ref 5–15)
AST: 42 U/L — ABNORMAL HIGH (ref 0–37)
Albumin: 4 g/dL (ref 3.5–5.2)
Alkaline Phosphatase: 68 U/L (ref 39–117)
BILIRUBIN TOTAL: 1 mg/dL (ref 0.3–1.2)
BUN: 6 mg/dL (ref 6–23)
CHLORIDE: 105 mmol/L (ref 96–112)
CO2: 22 mmol/L (ref 19–32)
CREATININE: 0.82 mg/dL (ref 0.50–1.10)
Calcium: 8.9 mg/dL (ref 8.4–10.5)
GFR calc Af Amer: >90 mL/min (ref >90)
Glucose, Bld: 106 mg/dL — ABNORMAL HIGH (ref 70–99)
POTASSIUM: 3.5 mmol/L (ref 3.5–5.1)
Sodium: 137 mmol/L (ref 135–145)
Total Protein: 8.1 g/dL (ref 6.0–8.3)

## 2015-03-18 LAB — D-DIMER, QUANTITATIVE (NOT AT ARMC): D DIMER QUANT: 0.55 ug{FEU}/mL — AB (ref 0.00–0.48)

## 2015-03-18 LAB — POC URINE PREG, ED: PREG TEST UR: NEGATIVE

## 2015-03-18 MED ORDER — ALBUTEROL SULFATE (2.5 MG/3ML) 0.083% IN NEBU
5.0000 mg | INHALATION_SOLUTION | Freq: Once | RESPIRATORY_TRACT | Status: AC
  Administered 2015-03-18: 5 mg via RESPIRATORY_TRACT
  Filled 2015-03-18: qty 6

## 2015-03-18 MED ORDER — PREDNISONE 20 MG PO TABS
60.0000 mg | ORAL_TABLET | Freq: Once | ORAL | Status: AC
  Administered 2015-03-18: 60 mg via ORAL
  Filled 2015-03-18: qty 3

## 2015-03-18 MED ORDER — IOHEXOL 350 MG/ML SOLN
80.0000 mL | Freq: Once | INTRAVENOUS | Status: AC | PRN
  Administered 2015-03-18: 80 mL via INTRAVENOUS

## 2015-03-18 MED ORDER — IPRATROPIUM BROMIDE 0.02 % IN SOLN
0.5000 mg | Freq: Once | RESPIRATORY_TRACT | Status: AC
  Administered 2015-03-18: 0.5 mg via RESPIRATORY_TRACT
  Filled 2015-03-18: qty 2.5

## 2015-03-18 MED ORDER — ALBUTEROL SULFATE HFA 108 (90 BASE) MCG/ACT IN AERS
2.0000 | INHALATION_SPRAY | RESPIRATORY_TRACT | Status: DC
  Administered 2015-03-18: 2 via RESPIRATORY_TRACT
  Filled 2015-03-18: qty 6.7

## 2015-03-18 MED ORDER — BENZONATATE 100 MG PO CAPS
100.0000 mg | ORAL_CAPSULE | Freq: Once | ORAL | Status: AC
  Administered 2015-03-18: 100 mg via ORAL
  Filled 2015-03-18: qty 1

## 2015-03-18 NOTE — Discharge Instructions (Signed)
Bronchospasm °A bronchospasm is a spasm or tightening of the airways going into the lungs. During a bronchospasm breathing becomes more difficult because the airways get smaller. When this happens there can be coughing, a whistling sound when breathing (wheezing), and difficulty breathing. Bronchospasm is often associated with asthma, but not all patients who experience a bronchospasm have asthma. °CAUSES  °A bronchospasm is caused by inflammation or irritation of the airways. The inflammation or irritation may be triggered by:  °· Allergies (such as to animals, pollen, food, or mold). Allergens that cause bronchospasm may cause wheezing immediately after exposure or many hours later.   °· Infection. Viral infections are believed to be the most common cause of bronchospasm.   °· Exercise.   °· Irritants (such as pollution, cigarette smoke, strong odors, aerosol sprays, and paint fumes).   °· Weather changes. Winds increase molds and pollens in the air. Rain refreshes the air by washing irritants out. Cold air may cause inflammation.   °· Stress and emotional upset.   °SIGNS AND SYMPTOMS  °· Wheezing.   °· Excessive nighttime coughing.   °· Frequent or severe coughing with a simple cold.   °· Chest tightness.   °· Shortness of breath.   °DIAGNOSIS  °Bronchospasm is usually diagnosed through a history and physical exam. Tests, such as chest X-rays, are sometimes done to look for other conditions. °TREATMENT  °· Inhaled medicines can be given to open up your airways and help you breathe. The medicines can be given using either an inhaler or a nebulizer machine. °· Corticosteroid medicines may be given for severe bronchospasm, usually when it is associated with asthma. °HOME CARE INSTRUCTIONS  °· Always have a plan prepared for seeking medical care. Know when to call your health care provider and local emergency services (911 in the [place] Know where you can access local emergency care. °· Only take medicines as  directed by your health care provider. °· If you were prescribed an inhaler or nebulizer machine, ask your health care provider to explain how to use it correctly. Always use a spacer with your inhaler if you were given one. °· It is necessary to remain calm during an attack. Try to relax and breathe more slowly.  °· Control your home environment in the following ways:   °¨ Change your heating and air conditioning filter at least once a month.   °¨ Limit your use of fireplaces and wood stoves. °¨ Do not smoke and do not allow smoking in your home.   °¨ Avoid exposure to perfumes and fragrances.   °¨ Get rid of pests (such as roaches and mice) and their droppings.   °¨ Throw away plants if you see mold on them.   °¨ Keep your house clean and dust free.   °¨ Replace carpet with wood, tile, or vinyl flooring. Carpet can trap dander and dust.   °¨ Use allergy-proof pillows, mattress covers, and box spring covers.   °¨ Wash bed sheets and blankets every week in hot water and dry them in a dryer.   °¨ Use blankets that are made of polyester or cotton.   °¨ Wash hands frequently. °SEEK MEDICAL CARE IF:  °· You have muscle aches.   °· You have chest pain.   °· The sputum changes from clear or white to yellow, green, gray, or bloody.   °· The sputum you cough up gets thicker.   °· There are problems that may be related to the medicine you are given, such as a rash, itching, swelling, or trouble breathing.   °SEEK IMMEDIATE MEDICAL CARE IF:  °· You have worsening wheezing and coughing even   after taking your prescribed medicines.   °· You have increased difficulty breathing.   °· You develop severe chest pain. °MAKE SURE YOU:  °· Understand these instructions. °· Will watch your condition. °· Will get help right away if you are not doing well or get worse. °Document Released: 11/30/2003 Document Revised: 12/02/2013 Document Reviewed: 05/19/2013 °[employer]® Patient Information ©2015 [employer]. This information is not  intended to replace advice given to you by your health care provider. Make sure you discuss any questions you have with your health care provider. ° °

## 2015-03-18 NOTE — ED Notes (Signed)
Pt presents to department for evaluation of shortness of breath. Ongoing for several days. Pt reports runny nose, sinus and chest congestion. Respirations labored upon arrival to ED.

## 2015-03-18 NOTE — ED Provider Notes (Signed)
8:05 PM Patient feels much better at this time after albuterol.  Aspirin suspect this is bronchitis with bronchospasm.  Heart rate elevated after the albuterol but I ambulate the patient and the hallway and she feels much better.  Walking O2 sat is 100%.  CTA is negative for pneumonia and pulmonary embolism.  Discharge home in good condition.  Primary care follow-up.  Dg Chest 2 View  03/18/2015   CLINICAL DATA:  Cough and congestion for 5 days  EXAM: CHEST  2 VIEW  COMPARISON:  03/04/2015  FINDINGS: The heart size and mediastinal contours are within normal limits. Both lungs are clear. The visualized skeletal structures are unremarkable.  IMPRESSION: No active cardiopulmonary disease.   Electronically Signed   By: Alcide CleverMark  Lukens M.D.   On: 03/18/2015 15:31   Ct Angio Chest Pe W/cm &/or Wo Cm  03/18/2015   CLINICAL DATA:  Shortness of breath for several days. Patient has a history of asthma.  EXAM: CT ANGIOGRAPHY CHEST WITH CONTRAST  TECHNIQUE: Multidetector CT imaging of the chest was performed using the standard protocol during bolus administration of intravenous contrast. Multiplanar CT image reconstructions and MIPs were obtained to evaluate the vascular anatomy.  CONTRAST:  70mL OMNIPAQUE IOHEXOL 350 MG/ML SOLN  COMPARISON:  Chest x-ray March 18, 2015  FINDINGS: There is no pulmonary embolus. The aorta is normal. There is no mediastinal or hilar lymphadenopathy. The heart size is normal. There is no pericardial effusion. The lungs are clear. There is no focal pneumonia or pleural effusion. There is diffuse low density within the liver consistent with fatty infiltration. The visualized upper abdominal structures are otherwise normal. The visualized bones are normal.  Review of the MIP images confirms the above findings.  IMPRESSION: No pulmonary embolus.  No acute abnormality identified in the chest.   Electronically Signed   By: Sherian ReinWei-Chen  Lin M.D.   On: 03/18/2015 18:36   Dg Abd Acute W/chest  03/04/2015    CLINICAL DATA:  19 year old female with cough for 2 weeks. Mid abdominal pain with nausea vomiting. Initial encounter.  EXAM: ACUTE ABDOMEN SERIES (ABDOMEN 2 VIEW & CHEST 1 VIEW)  COMPARISON:  None.  FINDINGS: Normal cardiac size and mediastinal contours. Normal lung volumes. No pneumothorax or pneumoperitoneum. No pleural effusion or abnormal pulmonary opacity. Visualized tracheal air column is within normal limits.  Non obstructed bowel gas pattern. Abdominal and pelvic visceral contours are normal. Mild to moderate volume of retained stool in the distal colon. No osseous abnormality identified.  IMPRESSION: 1.  Normal bowel gas pattern, no free air. 2. Negative chest.   Electronically Signed   By: Odessa FlemingH  Hall M.D.   On: 03/04/2015 12:36   I personally reviewed the imaging tests through PACS system I reviewed available ER/hospitalization records through the EMR   Azalia BilisKevin Tawanna Funk, MD 03/18/15 2006

## 2015-03-18 NOTE — Progress Notes (Signed)
Per Dr. Patria Maneampos it ok to scan patient without a pregnancy test

## 2015-03-18 NOTE — ED Notes (Signed)
Pt ambulated in hallway with steady gait.

## 2015-03-18 NOTE — ED Provider Notes (Signed)
CSN: [id number]     Arrival date & time 03/18/15  1347 History   First MD Initiated Contact with Patient 03/18/15 1417     Chief Complaint  Patient presents with  . Shortness of Breath     (Consider location/radiation/quality/duration/timing/severity/associated sxs/prior Treatment) HPI  This a 19 year old female with a recent history of bronchitis who presents with shortness of breath. Patient reports that she got "a cold" on Sunday. She reports nasal congestion and sore throat. She has had persistent cough since she was diagnosed with bronchitis at the end of March. She denies any fevers or chest pain. She states that earlier today she got acutely short of breath. She began to hyperventilate and got tingling in her fingers and around her lips. She denies any leg swelling. She is on birth control. Denies any chest pain.  Past Medical History  Diagnosis Date  . Seasonal allergies    History reviewed. No pertinent past surgical history. Family History  Problem Relation Age of Onset  . Diabetes Mother   . Hypertension Mother    History  Substance Use Topics  . Smoking status: Never Smoker   . Smokeless tobacco: Not on file  . Alcohol Use: No   OB History    No data available     Review of Systems  Constitutional: Negative for fever and chills.  HENT: Positive for congestion and sore throat. Negative for trouble swallowing.   Respiratory: Positive for cough and shortness of breath. Negative for chest tightness.   Cardiovascular: Negative for chest pain, palpitations and leg swelling.  Gastrointestinal: Negative for nausea, vomiting and abdominal pain.  Genitourinary: Negative for dysuria.  Musculoskeletal: Negative for back pain.  Neurological: Positive for numbness. Negative for headaches.  Psychiatric/Behavioral: Negative for confusion.  All other systems reviewed and are negative.     Allergies  Review of patient's allergies indicates no known allergies.  Home  Medications   Prior to Admission medications   Medication Sig Start Date End Date Taking? Authorizing Provider  diphenhydrAMINE (SOMINEX) 25 MG tablet Take 25 mg by mouth daily as needed for allergies or sleep.   Yes Historical Provider, MD  guaiFENesin-codeine 100-10 MG/5ML syrup Take 5 mLs by mouth 3 (three) times daily as needed for cough. 03/04/15  Yes Tiffany Neva SeatGreene, PA-C  ibuprofen (ADVIL,MOTRIN) 200 MG tablet Take 800 mg by mouth every 6 (six) hours as needed for mild pain or moderate pain.   Yes Historical Provider, MD  pseudoephedrine-acetaminophen (TYLENOL SINUS) 30-500 MG TABS Take 1 tablet by mouth every 4 (four) hours as needed (sinus pain).   Yes Historical Provider, MD  etonogestrel (NEXPLANON) 68 MG IMPL implant 1 each by Subdermal route once. Next implant due 8/16    Historical Provider, MD  ondansetron (ZOFRAN ODT) 4 MG disintegrating tablet Take 1 tablet (4 mg total) by mouth every 8 (eight) hours as needed for nausea or vomiting. Patient not taking: Reported on 03/18/2015 03/04/15   Marlon Peliffany Greene, PA-C  ondansetron (ZOFRAN) 4 MG tablet Take 1 tablet (4 mg total) by mouth every 8 (eight) hours as needed for nausea or vomiting. Patient not taking: Reported on 03/18/2015 02/21/15   Ozella Rocksavid J Merrell, MD   BP 123/60 mmHg  Pulse 136  Temp(Src) 99 F (37.2 C) (Oral)  Resp 19  SpO2 100%  LMP  (Within Months) Physical Exam  Constitutional: She is oriented to person, place, and time. She appears well-developed and well-nourished.  Mild tachypnea, speaking in short sentences, mouth breathing  HENT:  Head: Normocephalic and atraumatic.  Mouth/Throat: Oropharynx is clear and moist.  Nasal congestion  Eyes: Pupils are equal, round, and reactive to light.  Cardiovascular: Normal rate, regular rhythm and normal heart sounds.   Pulmonary/Chest: Effort normal and breath sounds normal. She has no wheezes.  Tachypnea, speaking in short sentences  Abdominal: Soft. Bowel sounds are normal.  There is no tenderness. There is no rebound.  Musculoskeletal: She exhibits no edema.  Neurological: She is alert and oriented to person, place, and time.  Skin: Skin is warm and dry.  Psychiatric:  Anxious appearing  Nursing note and vitals reviewed.   ED Course  Procedures (including critical care time) Labs Review Labs Reviewed  COMPREHENSIVE METABOLIC PANEL - Abnormal; Notable for the following:    Glucose, Bld 106 (*)    AST 42 (*)    ALT 38 (*)    All other components within normal limits  D-DIMER, QUANTITATIVE - Abnormal; Notable for the following:    D-Dimer, Quant 0.55 (*)    All other components within normal limits  CBC WITH DIFFERENTIAL/PLATELET  POC URINE PREG, ED    Imaging Review Dg Chest 2 View  03/18/2015   CLINICAL DATA:  Cough and congestion for 5 days  EXAM: CHEST  2 VIEW  COMPARISON:  03/04/2015  FINDINGS: The heart size and mediastinal contours are within normal limits. Both lungs are clear. The visualized skeletal structures are unremarkable.  IMPRESSION: No active cardiopulmonary disease.   Electronically Signed   By: Alcide Clever M.D.   On: 03/18/2015 15:31   Ct Angio Chest Pe W/cm &/or Wo Cm  03/18/2015   CLINICAL DATA:  Shortness of breath for several days. Patient has a history of asthma.  EXAM: CT ANGIOGRAPHY CHEST WITH CONTRAST  TECHNIQUE: Multidetector CT imaging of the chest was performed using the standard protocol during bolus administration of intravenous contrast. Multiplanar CT image reconstructions and MIPs were obtained to evaluate the vascular anatomy.  CONTRAST:  70mL OMNIPAQUE IOHEXOL 350 MG/ML SOLN  COMPARISON:  Chest x-ray March 18, 2015  FINDINGS: There is no pulmonary embolus. The aorta is normal. There is no mediastinal or hilar lymphadenopathy. The heart size is normal. There is no pericardial effusion. The lungs are clear. There is no focal pneumonia or pleural effusion. There is diffuse low density within the liver consistent with fatty  infiltration. The visualized upper abdominal structures are otherwise normal. The visualized bones are normal.  Review of the MIP images confirms the above findings.  IMPRESSION: No pulmonary embolus.  No acute abnormality identified in the chest.   Electronically Signed   By: Sherian Rein M.D.   On: 03/18/2015 18:36     EKG Interpretation   Date/Time:  Thursday March 18 2015 13:56:24 EDT Ventricular Rate:  127 PR Interval:  134 QRS Duration: 76 QT Interval:  300 QTC Calculation: 436 R Axis:   111 Text Interpretation:  Sinus tachycardia Right atrial enlargement Right  axis deviation Abnormal ECG No prior for comparison Confirmed by Yazmeen Woolf   MD, Toni Amend ([id number]) on 03/18/2015 2:40:02 PM      MDM   Final diagnoses:  Bronchitis  Bronchospasm    Patient presents with acute onset shortness of breath. Recent cold and diagnosis of bronchitis. Pulmonary exam on initial evaluation is clear. Suspect patient got acutely short of breath and then began to have an anxiety attack given the hypoventilation in the tingling in her fingers and lips. Given that she has a clear pulmonary exam, I  do not have a great reason for her acute shortness of breath. She is tachycardic into the 110s and on birth control. We'll send a screening d-dimer to screen for blood clots. D-dimer is positive. Patient awaiting CTA.  Signed out to Dr. Patria Mane.    Shon Baton, MD 03/19/15 [phone]

## 2015-06-23 ENCOUNTER — Other Ambulatory Visit: Payer: Self-pay | Admitting: Internal Medicine

## 2015-06-23 DIAGNOSIS — R7989 Other specified abnormal findings of blood chemistry: Secondary | ICD-10-CM

## 2015-06-23 DIAGNOSIS — R945 Abnormal results of liver function studies: Principal | ICD-10-CM

## 2015-06-25 ENCOUNTER — Ambulatory Visit
Admission: RE | Admit: 2015-06-25 | Discharge: 2015-06-25 | Disposition: A | Discharge status: Home / Self Care | Payer: BLUE CROSS/BLUE SHIELD | Source: Ambulatory Visit | Attending: Internal Medicine | Admitting: Internal Medicine

## 2015-06-25 DIAGNOSIS — R945 Abnormal results of liver function studies: Principal | ICD-10-CM

## 2015-06-25 DIAGNOSIS — R7989 Other specified abnormal findings of blood chemistry: Secondary | ICD-10-CM

## 2015-06-29 ENCOUNTER — Other Ambulatory Visit: Payer: Self-pay | Admitting: Internal Medicine

## 2015-06-29 DIAGNOSIS — Q8909 Congenital malformations of spleen: Secondary | ICD-10-CM

## 2015-07-01 ENCOUNTER — Ambulatory Visit
Admission: RE | Admit: 2015-07-01 | Discharge: 2015-07-01 | Disposition: A | Discharge status: Home / Self Care | Payer: BLUE CROSS/BLUE SHIELD | Source: Ambulatory Visit | Attending: Internal Medicine | Admitting: Internal Medicine

## 2015-07-01 DIAGNOSIS — Q8909 Congenital malformations of spleen: Secondary | ICD-10-CM

## 2015-07-01 MED ORDER — IOHEXOL 300 MG/ML  SOLN
10.0000 mL | Freq: Once | INTRAMUSCULAR | Status: AC | PRN
  Administered 2015-07-01: 10 mL via ORAL

## 2015-07-01 MED ORDER — IOPAMIDOL (ISOVUE-300) INJECTION 61%
100.0000 mL | Freq: Once | INTRAVENOUS | Status: AC | PRN
  Administered 2015-07-01: 100 mL via INTRAVENOUS

## 2015-08-21 ENCOUNTER — Emergency Department (HOSPITAL_COMMUNITY)
Admission: EM | Admit: 2015-08-21 | Discharge: 2015-08-21 | Disposition: A | Discharge status: Home / Self Care | Payer: BLUE CROSS/BLUE SHIELD | Attending: Emergency Medicine | Admitting: Emergency Medicine

## 2015-08-21 ENCOUNTER — Encounter (HOSPITAL_COMMUNITY): Payer: Self-pay | Admitting: Emergency Medicine

## 2015-08-21 DIAGNOSIS — R109 Unspecified abdominal pain: Secondary | ICD-10-CM | POA: Diagnosis present

## 2015-08-21 DIAGNOSIS — Z87440 Personal history of urinary (tract) infections: Secondary | ICD-10-CM | POA: Diagnosis not present

## 2015-08-21 DIAGNOSIS — Z793 Long term (current) use of hormonal contraceptives: Secondary | ICD-10-CM | POA: Diagnosis not present

## 2015-08-21 DIAGNOSIS — K529 Noninfective gastroenteritis and colitis, unspecified: Secondary | ICD-10-CM | POA: Insufficient documentation

## 2015-08-21 DIAGNOSIS — Z792 Long term (current) use of antibiotics: Secondary | ICD-10-CM | POA: Insufficient documentation

## 2015-08-21 LAB — CBC
HEMATOCRIT: 40.7 % (ref 36.0–46.0)
HEMOGLOBIN: 13 g/dL (ref 12.0–15.0)
MCH: 27.5 pg (ref 26.0–34.0)
MCHC: 31.9 g/dL (ref 30.0–36.0)
MCV: 86 fL (ref 78.0–100.0)
Platelets: 282 10*3/uL (ref 150–400)
RBC: 4.73 MIL/uL (ref 3.87–5.11)
RDW: 13.2 % (ref 11.5–15.5)
WBC: 7.3 10*3/uL (ref 4.0–10.5)

## 2015-08-21 LAB — COMPREHENSIVE METABOLIC PANEL
ALT: 29 U/L (ref 14–54)
AST: 34 U/L (ref 15–41)
Albumin: 3.9 g/dL (ref 3.5–5.0)
Alkaline Phosphatase: 42 U/L (ref 38–126)
Anion gap: 8 (ref 5–15)
BUN: <5 mg/dL — ABNORMAL LOW (ref 6–20)
CHLORIDE: 107 mmol/L (ref 101–111)
CO2: 22 mmol/L (ref 22–32)
Calcium: 9.1 mg/dL (ref 8.9–10.3)
Creatinine, Ser: 0.67 mg/dL (ref 0.44–1.00)
Glucose, Bld: 106 mg/dL — ABNORMAL HIGH (ref 65–99)
POTASSIUM: 3.4 mmol/L — AB (ref 3.5–5.1)
SODIUM: 137 mmol/L (ref 135–145)
Total Bilirubin: 0.9 mg/dL (ref 0.3–1.2)
Total Protein: 7.8 g/dL (ref 6.5–8.1)

## 2015-08-21 LAB — LIPASE, BLOOD: LIPASE: 26 U/L (ref 22–51)

## 2015-08-21 MED ORDER — ONDANSETRON 8 MG PO TBDP: 8.0000 mg | ORAL_TABLET | Freq: Three times a day (TID) | ORAL | Status: AC | PRN

## 2015-08-21 MED ORDER — ONDANSETRON 4 MG PO TBDP
ORAL_TABLET | ORAL | Status: AC
  Filled 2015-08-21: qty 1

## 2015-08-21 MED ORDER — PROMETHAZINE HCL 25 MG RE SUPP: 25.0000 mg | Freq: Four times a day (QID) | RECTAL | Status: AC | PRN

## 2015-08-21 MED ORDER — PROMETHAZINE HCL 25 MG/ML IJ SOLN
25.0000 mg | Freq: Once | INTRAMUSCULAR | Status: AC
  Administered 2015-08-21: 25 mg via INTRAVENOUS
  Filled 2015-08-21: qty 1

## 2015-08-21 MED ORDER — ONDANSETRON HCL 4 MG/2ML IJ SOLN
4.0000 mg | Freq: Once | INTRAMUSCULAR | Status: AC
  Administered 2015-08-21: 4 mg via INTRAVENOUS
  Filled 2015-08-21: qty 2

## 2015-08-21 MED ORDER — ONDANSETRON 4 MG PO TBDP
4.0000 mg | ORAL_TABLET | Freq: Once | ORAL | Status: AC | PRN
  Administered 2015-08-21: 4 mg via ORAL

## 2015-08-21 NOTE — ED Notes (Signed)
The paitent said she has thrown up a least 24 times in the last 24hrs.  She went to see her PCP thursday and she was given Zantac, Phenergan and Cipro and it is not helpig she is geting worse.  The patient rates her pain 8/10.

## 2015-08-21 NOTE — ED Notes (Signed)
Pt still unable to give urine specimen at this time.  

## 2015-08-21 NOTE — ED Notes (Signed)
Pt comfortable with discharge and follow up instructions. Pt declines wheelchair, escorted to waiting area by this RN. Prescriptions x2. 

## 2015-08-21 NOTE — ED Notes (Signed)
Pt given gingerale for PO challenge 

## 2015-08-21 NOTE — ED Notes (Signed)
Pt stated that she was diagnosed with a UTI Thursday and started taking antibiotics yesterday but is unable to keep them down.

## 2015-08-21 NOTE — Discharge Instructions (Signed)
We saw you in the ER for the vomiting, diarrhea, pain. All the results in the ER are normal. We are not sure what is causing your symptoms. The workup in the ER is not complete, and is limited to screening for life threatening and emergent conditions only, so please see a primary care doctor for further evaluation.  We do think that a stomach bug might be the cause.  Please hydrate well. Please take rectal suppository if needed for nausea control. Alternate between zofran and phenergan until then. Return to the Er if you are unable to keep any meds down.   Viral Gastroenteritis Viral gastroenteritis is also known as stomach flu. This condition affects the stomach and intestinal tract. It can cause sudden diarrhea and vomiting. The illness typically lasts 3 to 8 days. Most people develop an immune response that eventually gets rid of the virus. While this natural response develops, the virus can make you quite ill. CAUSES  Many different viruses can cause gastroenteritis, such as rotavirus or noroviruses. You can catch one of these viruses by consuming contaminated food or water. You may also catch a virus by sharing utensils or other personal items with an infected person or by touching a contaminated surface. SYMPTOMS  The most common symptoms are diarrhea and vomiting. These problems can cause a severe loss of body fluids (dehydration) and a body salt (electrolyte) imbalance. Other symptoms may include:  Fever.  Headache.  Fatigue.  Abdominal pain. DIAGNOSIS  Your caregiver can usually diagnose viral gastroenteritis based on your symptoms and a physical exam. A stool sample may also be taken to test for the presence of viruses or other infections. TREATMENT  This illness typically goes away on its own. Treatments are aimed at rehydration. The most serious cases of viral gastroenteritis involve vomiting so severely that you are not able to keep fluids down. In these cases, fluids must be  given through an intravenous line (IV). HOME CARE INSTRUCTIONS   Drink enough fluids to keep your urine clear or pale yellow. Drink small amounts of fluids frequently and increase the amounts as tolerated.  Ask your caregiver for specific rehydration instructions.  Avoid:  Foods high in sugar.  Alcohol.  Carbonated drinks.  Tobacco.  Juice.  Caffeine drinks.  Extremely hot or cold fluids.  Fatty, greasy foods.  Too much intake of anything at one time.  Dairy products until 24 to 48 hours after diarrhea stops.  You may consume probiotics. Probiotics are active cultures of beneficial bacteria. They may lessen the amount and number of diarrheal stools in adults. Probiotics can be found in yogurt with active cultures and in supplements.  Wash your hands well to avoid spreading the virus.  Only take over-the-counter or prescription medicines for pain, discomfort, or fever as directed by your caregiver. Do not give aspirin to children. Antidiarrheal medicines are not recommended.  Ask your caregiver if you should continue to take your regular prescribed and over-the-counter medicines.  Keep all follow-up appointments as directed by your caregiver. SEEK IMMEDIATE MEDICAL CARE IF:   You are unable to keep fluids down.  You do not urinate at least once every 6 to 8 hours.  You develop shortness of breath.  You notice blood in your stool or vomit. This may look like coffee grounds.  You have abdominal pain that increases or is concentrated in one small area (localized).  You have persistent vomiting or diarrhea.  You have a fever.  The patient is a child  younger than 3 months, and he or she has a fever.  The patient is a child older than 3 months, and he or she has a fever and persistent symptoms.  The patient is a child older than 3 months, and he or she has a fever and symptoms suddenly get worse.  The patient is a baby, and he or she has no tears when  crying. MAKE SURE YOU:   Understand these instructions.  Will watch your condition.  Will get help right away if you are not doing well or get worse. Document Released: 11/27/2005 Document Revised: 02/19/2012 Document Reviewed: 09/13/2011 [facility] Patient Information 2015 [place], [place]. This information is not intended to replace advice given to you by your health care provider. Make sure you discuss any questions you have with your health care provider. Clear Liquid Diet A clear liquid diet is a short-term diet that is prescribed to provide the necessary fluid and basic energy you need when you can have nothing else. The clear liquid diet consists of liquids or solids that will become liquid at room temperature. You should be able to see through the liquid. There are many reasons that you may be restricted to clear liquids, such as:  When you have a sudden-onset (acute) condition that occurs before or after surgery.  To help your body slowly get adjusted to food again after a long period when you were unable to have food.  Replacement of fluids when you have a diarrheal disease.  When you are going to have certain exams, such as a colonoscopy, in which instruments are inserted inside your body to look at parts of your digestive system. WHAT CAN I HAVE? A clear liquid diet does not provide all the nutrients you need. It is important to choose a variety of the following items to get as many nutrients as possible:  Vegetable juices that do not have pulp.  Fruit juices and fruit drinks that do not have pulp.  Coffee (regular or decaffeinated), tea, or soda at the discretion of your health care provider.  Clear bouillon, broth, or strained broth-based soups.  High-protein and flavored gelatins.  Sugar or honey.  Ices or frozen ice pops that do not contain milk. If you are not sure whether you can have certain items, you should ask your health care provider. You may also ask your  health care provider if there are any other clear liquid options. Document Released: 11/27/2005 Document Revised: 12/02/2013 Document Reviewed: 10/24/2013 [facility] Patient Information 2015 [place], [place]. This information is not intended to replace advice given to you by your health care provider. Make sure you discuss any questions you have with your health care provider.

## 2015-08-21 NOTE — ED Notes (Signed)
After drinking small amount of ginger ale pt states "i'm nauseous again" Dr Rhunette Croft notified and stated that the pt can have zofran, pt has no signs of dehydration.

## 2015-08-21 NOTE — ED Provider Notes (Signed)
CSN: [id number]     Arrival date & time 08/21/15  0042 History   First MD Initiated Contact with Patient 08/21/15 [phone]     Chief Complaint  Patient presents with  . Abdominal Pain    The paitent said she has thrown up a least 24 times in the last 24hrs.  She went to see her PCP thursday and she was given Zantac, Phenergan and Cipro and it is not helpig she is geting worse.     (Consider location/radiation/quality/duration/timing/severity/associated sxs/prior Treatment) HPI Comments: Pt has been unwell since Sunday. She has some uri like sx and headache. Pt saw her pcp on Thursday for abd pain and fever. Shwe was diagnosed with uti and started on cipro. She was also informed that she might have a stomach pain. Her stomach pain started on Sunday and is diffuse and is mostly anterior. She has no uti like sx. Pt also reports diarrhea since Thursday - 2-3 loose BM. Emesis started Thursday - 20+ episode of emesis/24 hours. Pt has bilious emesis. No vaginal bleeding, discharge. No hx of std, no risk factors for std. No hx of pelvic dz.   Patient is a 19 y.o. female presenting with abdominal pain. The history is provided by the patient.  Abdominal Pain Associated symptoms: diarrhea, nausea and vomiting   Associated symptoms: no chest pain, no dysuria and no shortness of breath     Past Medical History  Diagnosis Date  . Seasonal allergies    History reviewed. No pertinent past surgical history. Family History  Problem Relation Age of Onset  . Diabetes Mother   . Hypertension Mother    Social History  Substance Use Topics  . Smoking status: Never Smoker   . Smokeless tobacco: None  . Alcohol Use: No   OB History    No data available     Review of Systems  Constitutional: Positive for activity change.  Respiratory: Negative for shortness of breath.   Cardiovascular: Negative for chest pain.  Gastrointestinal: Positive for nausea, vomiting, abdominal pain and diarrhea.   Genitourinary: Negative for dysuria.  Musculoskeletal: Negative for neck pain.  Neurological: Negative for headaches.  All other systems reviewed and are negative.     Allergies  Review of patient's allergies indicates no known allergies.  Home Medications   Prior to Admission medications   Medication Sig Start Date End Date Taking? Authorizing Provider  ciprofloxacin (CIPRO) 500 MG tablet Take 500 mg by mouth 2 (two) times daily.   Yes Historical Provider, MD  etonogestrel (NEXPLANON) 68 MG IMPL implant 1 each by Subdermal route once. Next implant due 8/16   Yes Historical Provider, MD  promethazine (PHENERGAN) 25 MG tablet Take 25 mg by mouth every 6 (six) hours as needed for nausea or vomiting.   Yes Historical Provider, MD  guaiFENesin-codeine 100-10 MG/5ML syrup Take 5 mLs by mouth 3 (three) times daily as needed for cough. Patient not taking: Reported on 08/21/2015 03/04/15   Marlon Pel, PA-C  ondansetron (ZOFRAN ODT) 8 MG disintegrating tablet Take 1 tablet (8 mg total) by mouth every 8 (eight) hours as needed for nausea. 08/21/15   Derwood Kaplan, MD  promethazine (PHENERGAN) 25 MG suppository Place 1 suppository (25 mg total) rectally every 6 (six) hours as needed for nausea. 08/21/15   Junius Faucett Rhunette Croft, MD   BP 123/60 mmHg  Pulse 92  Temp(Src) 98.5 F (36.9 C) (Oral)  Resp 18  SpO2 100%  LMP 08/01/2015 Physical Exam  Constitutional: She is oriented  to person, place, and time. She appears well-developed and well-nourished.  HENT:  Head: Normocephalic and atraumatic.  Eyes: EOM are normal. Pupils are equal, round, and reactive to light.  Neck: Neck supple.  Cardiovascular: Normal rate, regular rhythm and normal heart sounds.   No murmur heard. Pulmonary/Chest: Effort normal. No respiratory distress.  Abdominal: Soft. She exhibits no distension. There is tenderness. There is no rebound and no guarding.  Diffuse tenderness around the umbilicus  Neurological: She is  alert and oriented to person, place, and time.  Skin: Skin is warm and dry.  Nursing note and vitals reviewed.   ED Course  Procedures (including critical care time) Labs Review Labs Reviewed  COMPREHENSIVE METABOLIC PANEL - Abnormal; Notable for the following:    Potassium 3.4 (*)    Glucose, Bld 106 (*)    BUN <5 (*)    All other components within normal limits  LIPASE, BLOOD  CBC  URINALYSIS, ROUTINE W REFLEX MICROSCOPIC (NOT AT [facility])  POC URINE PREG, ED    Imaging Review No results found. I have personally reviewed and evaluated these images and lab results as part of my medical decision-making.   EKG Interpretation None      MDM   Final diagnoses:  Gastroenteritis    Pt comes in with abd pain, emesis, diarrhea. Pt has seen pcp and is being treated for uti. She has no uti like sx and no flank pain - doubt pyelonephritis. She has non peritoneal periumbilical pain since Thursday. She reports several episodes of emesis. She is not dry on exam, and vitals dont suggest any decompensation. She has no std risk factors or hx of it- and with no vaginal d/c, bleeding - she doesn't want a pelvic exam, and i think that is appropriate.  Pt passed oral challenge here, though she still has nausea. Will d.c.   Derwood Kaplan, MD 08/21/15 [phone]

## 2016-03-18 IMAGING — CT CT ANGIO CHEST
1 of 8 series · 17 of 36 positions shown · IV contrast (Iohexol (Omnipaque 350))
Comparison: Chest x-ray March 18, 2015

CLINICAL DATA: Shortness of breath for several days. Patient has a
history of asthma.

EXAM:
CT ANGIOGRAPHY CHEST WITH CONTRAST
TECHNIQUE: Multidetector CT imaging of the chest was performed using the
standard protocol during bolus administration of intravenous
contrast. Multiplanar CT image reconstructions and MIPs were
obtained to evaluate the vascular anatomy.
CONTRAST:  70mL OMNIPAQUE IOHEXOL 350 MG/ML SOLN

[Series 406: thins pacs · axial · 0.61mm/px · z∈[+17,+257]mm · 17 of 270 slices shown]
[im 15/270  lung]
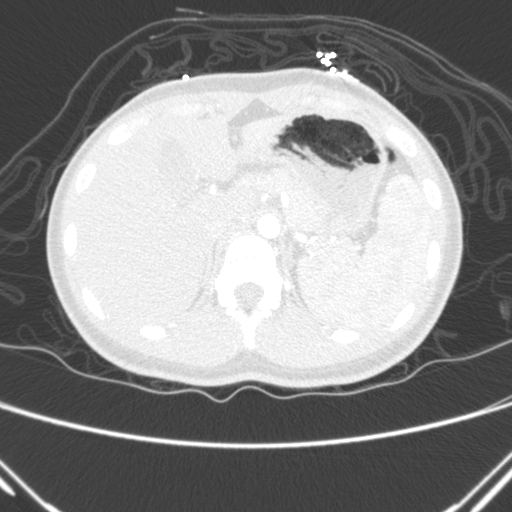
[im 30/270  mediastinal]
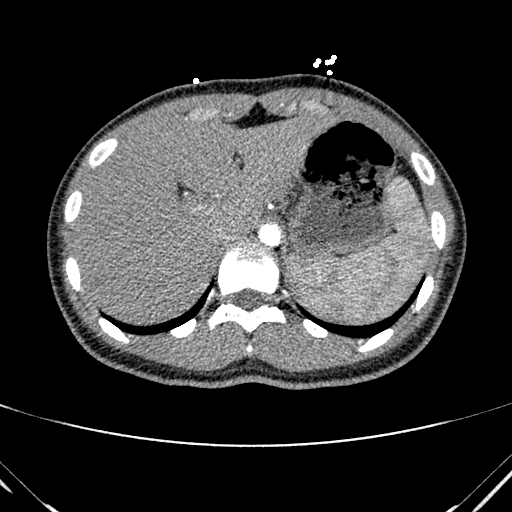
[im 45/270  lung]
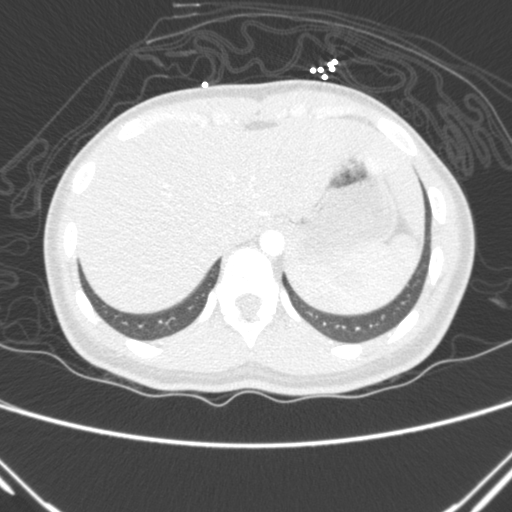
[im 60/270  mediastinal]
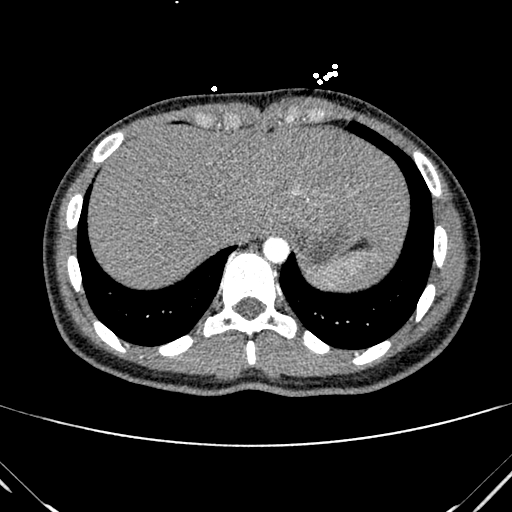
[im 75/270  lung]
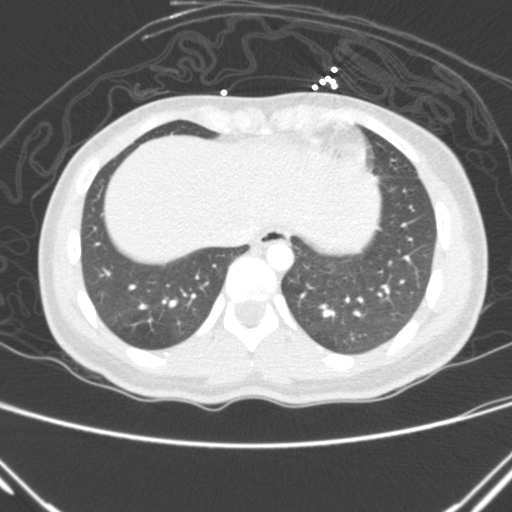
[im 90/270  mediastinal]
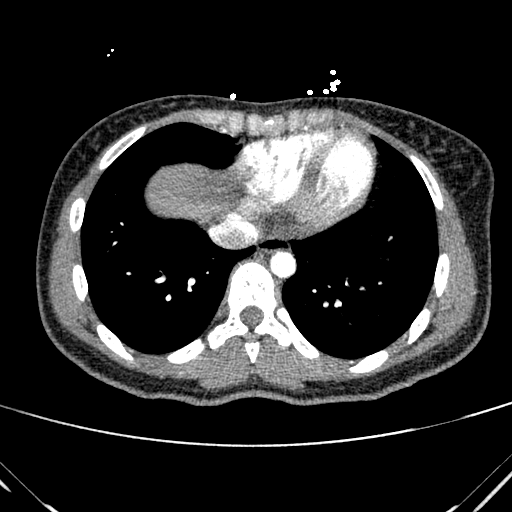
[im 105/270  lung]
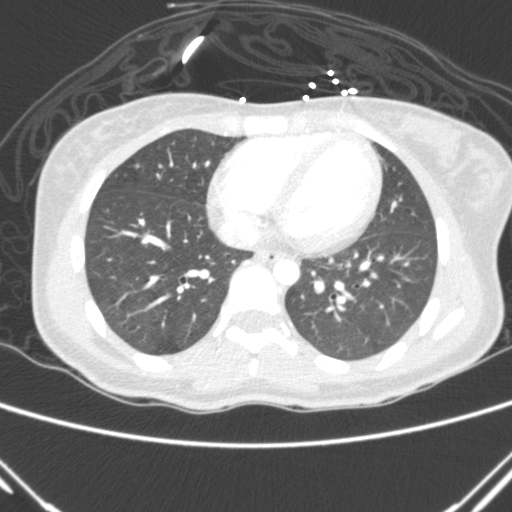
[im 120/270  mediastinal]
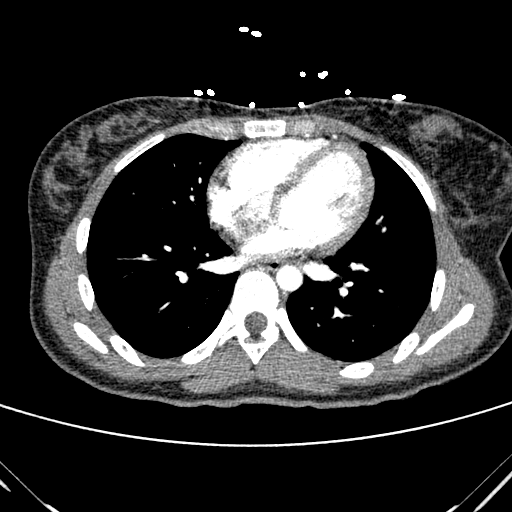
[im 135/270  lung]
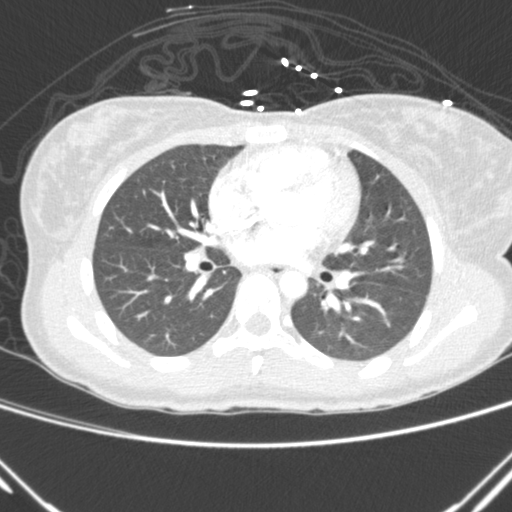
[im 150/270  mediastinal]
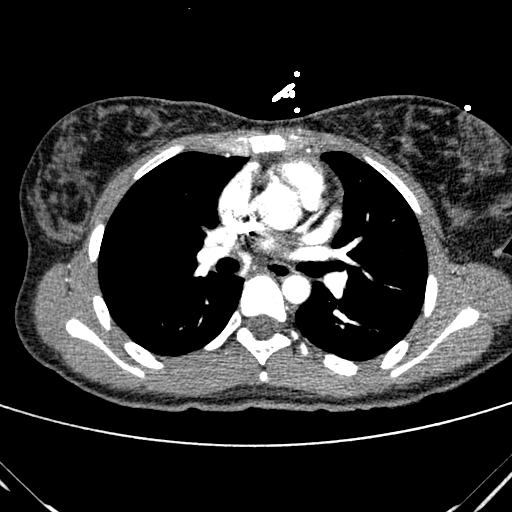
[im 165/270  lung]
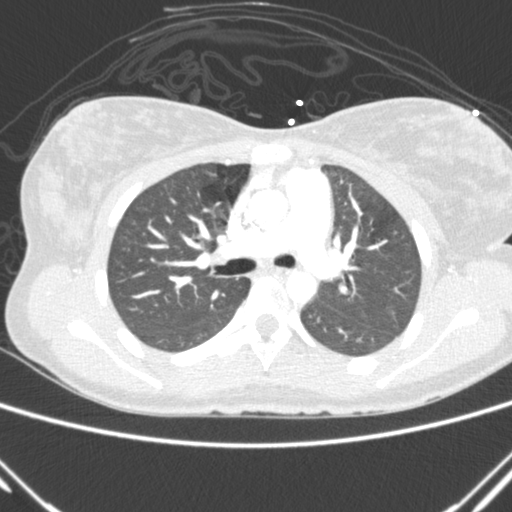
[im 180/270  mediastinal]
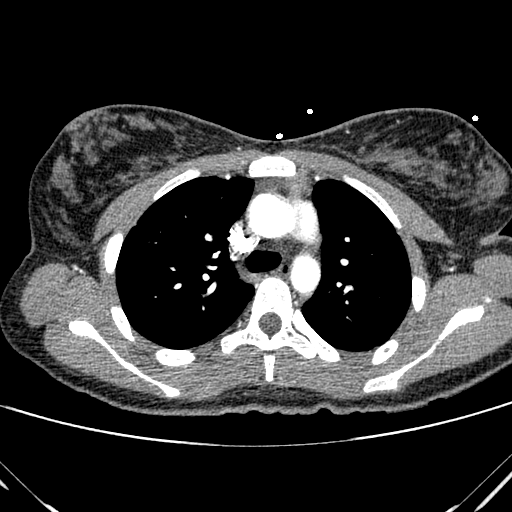
[im 195/270  lung]
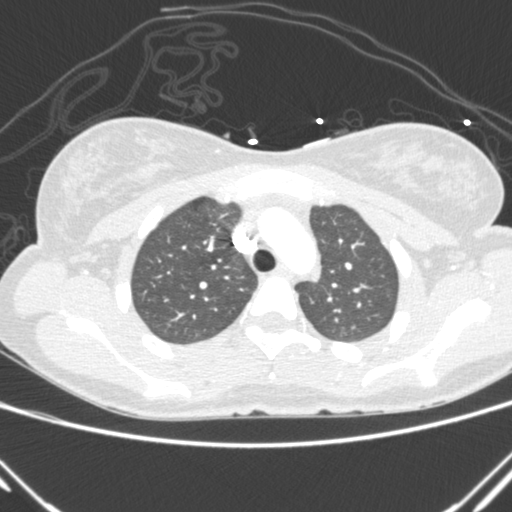
[im 210/270  mediastinal]
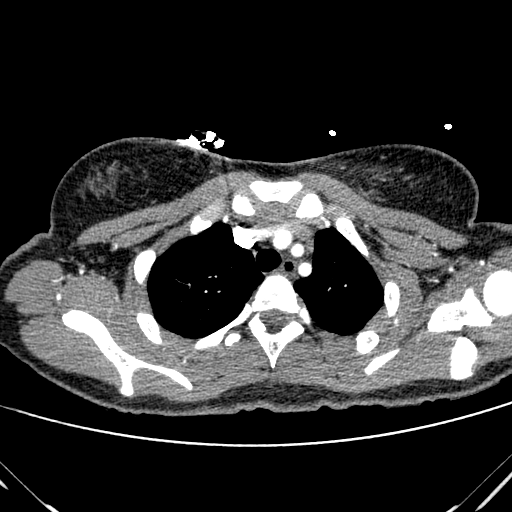
[im 225/270  lung]
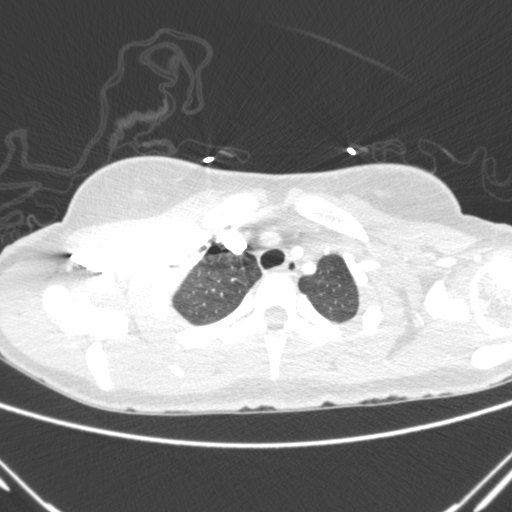
[im 240/270  mediastinal]
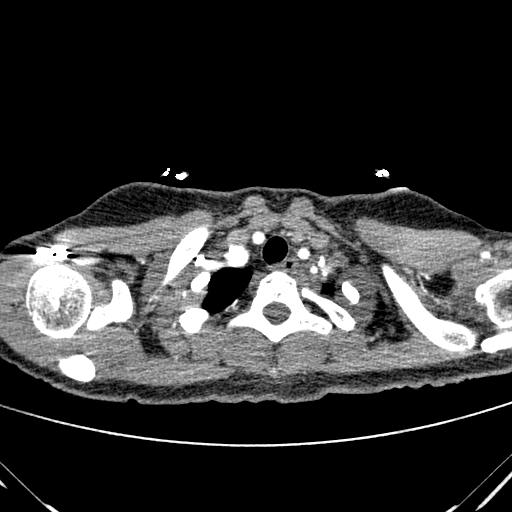
[im 255/270  lung]
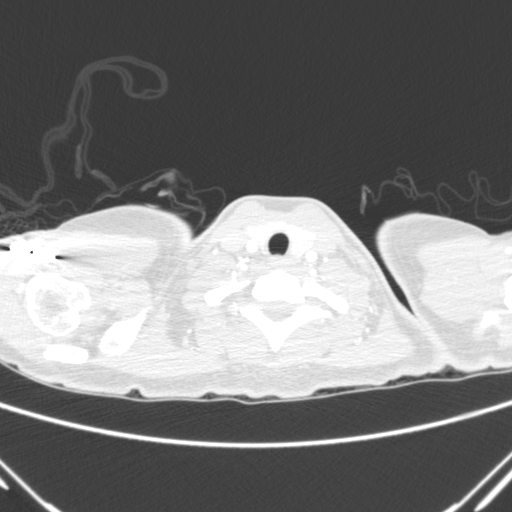

[17 of 36 positions shown; findings below may reference images not displayed]

FINDINGS: There is no pulmonary embolus. The aorta is normal. There is no
mediastinal or hilar lymphadenopathy. The heart size is normal.
There is no pericardial effusion. The lungs are clear. There is no
focal pneumonia or pleural effusion. There is diffuse low density
within the liver consistent with fatty infiltration. The visualized
upper abdominal structures are otherwise normal. The visualized
bones are normal.

Review of the MIP images confirms the above findings.
IMPRESSION: No pulmonary embolus.  No acute abnormality identified in the chest.
